# Patient Record
Sex: Male | Born: 1940
Health system: Southern US, Community
[De-identification: ages and names within clinical notes are randomized; demographics above are authoritative.]

## PROBLEM LIST (undated history)

## (undated) DIAGNOSIS — Z8719 Personal history of other diseases of the digestive system: Secondary | ICD-10-CM

## (undated) DIAGNOSIS — Z889 Allergy status to unspecified drugs, medicaments and biological substances status: Secondary | ICD-10-CM

## (undated) DIAGNOSIS — G8929 Other chronic pain: Secondary | ICD-10-CM

## (undated) DIAGNOSIS — N200 Calculus of kidney: Secondary | ICD-10-CM

## (undated) DIAGNOSIS — E041 Nontoxic single thyroid nodule: Secondary | ICD-10-CM

## (undated) DIAGNOSIS — I10 Essential (primary) hypertension: Secondary | ICD-10-CM

## (undated) DIAGNOSIS — IMO0001 Reserved for inherently not codable concepts without codable children: Secondary | ICD-10-CM

## (undated) DIAGNOSIS — M1990 Unspecified osteoarthritis, unspecified site: Secondary | ICD-10-CM

## (undated) DIAGNOSIS — C61 Malignant neoplasm of prostate: Secondary | ICD-10-CM

## (undated) DIAGNOSIS — E785 Hyperlipidemia, unspecified: Secondary | ICD-10-CM

## (undated) DIAGNOSIS — H9190 Unspecified hearing loss, unspecified ear: Secondary | ICD-10-CM

## (undated) DIAGNOSIS — M549 Dorsalgia, unspecified: Secondary | ICD-10-CM

## (undated) DIAGNOSIS — K219 Gastro-esophageal reflux disease without esophagitis: Secondary | ICD-10-CM

## (undated) HISTORY — PX: COLONOSCOPY: SHX174

## (undated) HISTORY — PX: PROSTATE BIOPSY: SHX241

## (undated) HISTORY — PX: OTHER SURGICAL HISTORY: SHX169

## (undated) HISTORY — PX: MOUTH SURGERY: SHX715

## (undated) HISTORY — PX: TONSILLECTOMY: SUR1361

## (undated) HISTORY — PX: VASECTOMY: SHX75

## (undated) HISTORY — PX: BIOPSY THYROID: PRO38

---

## 2000-04-13 ENCOUNTER — Ambulatory Visit (HOSPITAL_COMMUNITY): Admission: RE | Admit: 2000-04-13 | Discharge: 2000-04-13 | Payer: Self-pay | Admitting: *Deleted

## 2008-01-24 ENCOUNTER — Encounter: Admission: RE | Admit: 2008-01-24 | Discharge: 2008-01-24 | Payer: Self-pay | Admitting: Family Medicine

## 2008-02-06 ENCOUNTER — Encounter: Admission: RE | Admit: 2008-02-06 | Discharge: 2008-02-06 | Payer: Self-pay | Admitting: Family Medicine

## 2008-03-26 ENCOUNTER — Encounter: Admission: RE | Admit: 2008-03-26 | Discharge: 2008-03-26 | Payer: Self-pay | Admitting: Internal Medicine

## 2008-03-26 ENCOUNTER — Other Ambulatory Visit: Admission: RE | Admit: 2008-03-26 | Discharge: 2008-03-26 | Payer: Self-pay | Admitting: Interventional Radiology

## 2008-03-26 ENCOUNTER — Encounter (INDEPENDENT_AMBULATORY_CARE_PROVIDER_SITE_OTHER): Payer: Self-pay | Admitting: Interventional Radiology

## 2009-04-14 ENCOUNTER — Encounter: Admission: RE | Admit: 2009-04-14 | Discharge: 2009-04-14 | Payer: Self-pay | Admitting: Internal Medicine

## 2010-04-16 ENCOUNTER — Encounter: Admission: RE | Admit: 2010-04-16 | Discharge: 2010-04-16 | Payer: Self-pay | Admitting: Internal Medicine

## 2010-06-06 ENCOUNTER — Encounter: Payer: Self-pay | Admitting: Internal Medicine

## 2010-06-07 ENCOUNTER — Encounter: Payer: Self-pay | Admitting: Family Medicine

## 2011-04-22 ENCOUNTER — Other Ambulatory Visit: Payer: Self-pay | Admitting: Internal Medicine

## 2011-04-22 DIAGNOSIS — E042 Nontoxic multinodular goiter: Secondary | ICD-10-CM

## 2011-04-26 ENCOUNTER — Other Ambulatory Visit: Payer: Self-pay | Admitting: Neurosurgery

## 2011-04-26 DIAGNOSIS — M545 Low back pain: Secondary | ICD-10-CM

## 2011-04-27 ENCOUNTER — Ambulatory Visit
Admission: RE | Admit: 2011-04-27 | Discharge: 2011-04-27 | Disposition: A | Discharge status: Home / Self Care | Payer: Medicare Other | Source: Ambulatory Visit | Attending: Internal Medicine | Admitting: Internal Medicine

## 2011-04-27 DIAGNOSIS — E042 Nontoxic multinodular goiter: Secondary | ICD-10-CM

## 2011-04-29 ENCOUNTER — Ambulatory Visit
Admission: RE | Admit: 2011-04-29 | Discharge: 2011-04-29 | Disposition: A | Discharge status: Home / Self Care | Payer: Medicare Other | Source: Ambulatory Visit | Attending: Neurosurgery | Admitting: Neurosurgery

## 2011-04-29 DIAGNOSIS — M545 Low back pain: Secondary | ICD-10-CM

## 2011-05-18 ENCOUNTER — Other Ambulatory Visit: Payer: Self-pay | Admitting: Neurosurgery

## 2011-05-18 ENCOUNTER — Ambulatory Visit
Admission: RE | Admit: 2011-05-18 | Discharge: 2011-05-18 | Disposition: A | Discharge status: Home / Self Care | Payer: Medicare Other | Source: Ambulatory Visit | Attending: Neurosurgery | Admitting: Neurosurgery

## 2011-05-18 DIAGNOSIS — M47817 Spondylosis without myelopathy or radiculopathy, lumbosacral region: Secondary | ICD-10-CM | POA: Diagnosis not present

## 2011-05-18 DIAGNOSIS — M48061 Spinal stenosis, lumbar region without neurogenic claudication: Secondary | ICD-10-CM

## 2011-05-18 DIAGNOSIS — M4310 Spondylolisthesis, site unspecified: Secondary | ICD-10-CM | POA: Diagnosis not present

## 2011-07-25 ENCOUNTER — Encounter (HOSPITAL_COMMUNITY): Payer: Self-pay | Admitting: Pharmacy Technician

## 2011-07-25 NOTE — Pre-Procedure Instructions (Signed)
20 ARCANGEL MINION  07/25/2011   Your procedure is scheduled on:  Tues, Mar 19 @ 0730  Report to [facility] at 0530 AM.  Call this number if you have problems the morning of surgery: [phone]   Remember:   Do not eat food:After Midnight.  May have clear liquids: up to 4 Hours before arrival.(until 1:30 am)  Clear liquids include soda, tea, black coffee, apple or grape juice, broth.,water  Take these medicines the morning of surgery with A SIP OF WATER: Atenolol and Zantac   Do not wear jewelry, make-up or nail polish.  Do not wear lotions, powders, or perfumes. You may wear deodorant.  Do not shave 48 hours prior to surgery.  Do not bring valuables to the hospital.  Contacts, dentures or bridgework may not be worn into surgery.  Leave suitcase in the car. After surgery it may be brought to your room.  For patients admitted to the hospital, checkout time is 11:00 AM the day of discharge.    Special Instructions: CHG Shower Use Special Wash: 1/2 bottle night before surgery and 1/2 bottle morning of surgery.   Please read over the following fact sheets that you were given: Pain Booklet, Coughing and Deep Breathing, Blood Transfusion Information, MRSA Information and Surgical Site Infection Prevention

## 2011-07-26 ENCOUNTER — Encounter (HOSPITAL_COMMUNITY)
Admission: RE | Admit: 2011-07-26 | Discharge: 2011-07-26 | Disposition: A | Discharge status: Home / Self Care | Payer: Medicare Other | Source: Ambulatory Visit | Attending: Neurosurgery | Admitting: Neurosurgery

## 2011-07-26 ENCOUNTER — Other Ambulatory Visit: Payer: Self-pay

## 2011-07-26 ENCOUNTER — Encounter (HOSPITAL_COMMUNITY): Payer: Self-pay

## 2011-07-26 ENCOUNTER — Encounter (HOSPITAL_COMMUNITY)
Admission: RE | Admit: 2011-07-26 | Discharge: 2011-07-26 | Disposition: A | Discharge status: Home / Self Care | Payer: Medicare Other | Source: Ambulatory Visit | Attending: Anesthesiology | Admitting: Anesthesiology

## 2011-07-26 DIAGNOSIS — Z0289 Encounter for other administrative examinations: Secondary | ICD-10-CM | POA: Diagnosis not present

## 2011-07-26 DIAGNOSIS — M48062 Spinal stenosis, lumbar region with neurogenic claudication: Secondary | ICD-10-CM | POA: Diagnosis not present

## 2011-07-26 DIAGNOSIS — I10 Essential (primary) hypertension: Secondary | ICD-10-CM | POA: Diagnosis not present

## 2011-07-26 DIAGNOSIS — M4310 Spondylolisthesis, site unspecified: Secondary | ICD-10-CM | POA: Diagnosis not present

## 2011-07-26 DIAGNOSIS — E785 Hyperlipidemia, unspecified: Secondary | ICD-10-CM | POA: Diagnosis not present

## 2011-07-26 HISTORY — DX: Personal history of other diseases of the digestive system: Z87.19

## 2011-07-26 HISTORY — DX: Essential (primary) hypertension: I10

## 2011-07-26 HISTORY — DX: Nontoxic single thyroid nodule: E04.1

## 2011-07-26 HISTORY — DX: Calculus of kidney: N20.0

## 2011-07-26 HISTORY — DX: Gastro-esophageal reflux disease without esophagitis: K21.9

## 2011-07-26 HISTORY — DX: Other chronic pain: G89.29

## 2011-07-26 HISTORY — DX: Allergy status to unspecified drugs, medicaments and biological substances: Z88.9

## 2011-07-26 HISTORY — DX: Hyperlipidemia, unspecified: E78.5

## 2011-07-26 HISTORY — DX: Reserved for inherently not codable concepts without codable children: IMO0001

## 2011-07-26 HISTORY — DX: Unspecified hearing loss, unspecified ear: H91.90

## 2011-07-26 HISTORY — DX: Dorsalgia, unspecified: M54.9

## 2011-07-26 HISTORY — DX: Unspecified osteoarthritis, unspecified site: M19.90

## 2011-07-26 LAB — CBC
HCT: 46.5 % (ref 39.0–52.0)
MCH: 32.5 pg (ref 26.0–34.0)
MCV: 91.5 fL (ref 78.0–100.0)
Platelets: 254 10*3/uL (ref 150–400)
RBC: 5.08 MIL/uL (ref 4.22–5.81)
WBC: 9 10*3/uL (ref 4.0–10.5)

## 2011-07-26 LAB — BASIC METABOLIC PANEL
CO2: 29 mEq/L (ref 19–32)
Calcium: 10.5 mg/dL (ref 8.4–10.5)
Chloride: 100 mEq/L (ref 96–112)
Creatinine, Ser: 0.95 mg/dL (ref 0.50–1.35)
Glucose, Bld: 95 mg/dL (ref 70–99)
Sodium: 139 mEq/L (ref 135–145)

## 2011-07-26 LAB — DIFFERENTIAL
Eosinophils Absolute: 0.3 10*3/uL (ref 0.0–0.7)
Eosinophils Relative: 4 % (ref 0–5)
Lymphocytes Relative: 24 % (ref 12–46)
Lymphs Abs: 2.1 10*3/uL (ref 0.7–4.0)
Monocytes Absolute: 0.7 10*3/uL (ref 0.1–1.0)

## 2011-07-26 LAB — SURGICAL PCR SCREEN
MRSA, PCR: NEGATIVE
Staphylococcus aureus: NEGATIVE

## 2011-07-26 LAB — TYPE AND SCREEN: ABO/RH(D): A POS

## 2011-07-26 LAB — ABO/RH: ABO/RH(D): A POS

## 2011-07-26 NOTE — Progress Notes (Signed)
Dr.Varanasi is cardiologist;last visit a couple of years ago and was released  Echo done about 68yrs ago Stress test done > 27yrs ago Denies having a heart cath

## 2011-07-27 NOTE — Progress Notes (Signed)
Voicemail left with Erie Noe at Dr. Lindalou Hose office requesting orders as they were illegible.

## 2011-08-01 ENCOUNTER — Other Ambulatory Visit (HOSPITAL_COMMUNITY): Payer: Self-pay | Admitting: *Deleted

## 2011-08-01 MED ORDER — CEFAZOLIN SODIUM-DEXTROSE 2-3 GM-% IV SOLR
2.0000 g | INTRAVENOUS | Status: AC
  Administered 2011-08-02: 2 g via INTRAVENOUS
  Filled 2011-08-01: qty 50

## 2011-08-01 MED ORDER — CEFAZOLIN SODIUM 1-5 GM-% IV SOLN: 1.0000 g | INTRAVENOUS | Status: DC

## 2011-08-01 NOTE — Progress Notes (Signed)
Spoke with Erie Noe at Dr Dalphine Handing office to obtain orders with permit clarified and she stated "OK I will send them over"

## 2011-08-02 ENCOUNTER — Inpatient Hospital Stay (HOSPITAL_COMMUNITY): Payer: Medicare Other | Admitting: Certified Registered"

## 2011-08-02 ENCOUNTER — Inpatient Hospital Stay (HOSPITAL_COMMUNITY)
Admission: RE | Admit: 2011-08-02 | Discharge: 2011-08-03 | DRG: 460 | Disposition: A | Discharge status: Home / Self Care | Payer: Medicare Other | Source: Ambulatory Visit | Attending: Neurosurgery | Admitting: Neurosurgery

## 2011-08-02 ENCOUNTER — Encounter (HOSPITAL_COMMUNITY): Payer: Self-pay | Admitting: *Deleted

## 2011-08-02 ENCOUNTER — Encounter (HOSPITAL_COMMUNITY): Payer: Self-pay | Admitting: Certified Registered"

## 2011-08-02 ENCOUNTER — Encounter (HOSPITAL_COMMUNITY)
Admission: RE | Disposition: A | Discharge status: Home / Self Care | Payer: Self-pay | Source: Ambulatory Visit | Attending: Neurosurgery

## 2011-08-02 ENCOUNTER — Encounter (HOSPITAL_COMMUNITY): Payer: Self-pay | Admitting: Neurosurgery

## 2011-08-02 ENCOUNTER — Inpatient Hospital Stay (HOSPITAL_COMMUNITY): Payer: Medicare Other

## 2011-08-02 DIAGNOSIS — Z91041 Radiographic dye allergy status: Secondary | ICD-10-CM

## 2011-08-02 DIAGNOSIS — G8929 Other chronic pain: Secondary | ICD-10-CM | POA: Diagnosis present

## 2011-08-02 DIAGNOSIS — K219 Gastro-esophageal reflux disease without esophagitis: Secondary | ICD-10-CM | POA: Diagnosis present

## 2011-08-02 DIAGNOSIS — Z79899 Other long term (current) drug therapy: Secondary | ICD-10-CM | POA: Diagnosis not present

## 2011-08-02 DIAGNOSIS — I10 Essential (primary) hypertension: Secondary | ICD-10-CM | POA: Diagnosis not present

## 2011-08-02 DIAGNOSIS — M4310 Spondylolisthesis, site unspecified: Secondary | ICD-10-CM | POA: Diagnosis present

## 2011-08-02 DIAGNOSIS — E785 Hyperlipidemia, unspecified: Secondary | ICD-10-CM | POA: Diagnosis present

## 2011-08-02 DIAGNOSIS — M79609 Pain in unspecified limb: Secondary | ICD-10-CM | POA: Diagnosis not present

## 2011-08-02 DIAGNOSIS — M519 Unspecified thoracic, thoracolumbar and lumbosacral intervertebral disc disorder: Secondary | ICD-10-CM | POA: Diagnosis not present

## 2011-08-02 DIAGNOSIS — Q762 Congenital spondylolisthesis: Secondary | ICD-10-CM | POA: Diagnosis not present

## 2011-08-02 DIAGNOSIS — Z01812 Encounter for preprocedural laboratory examination: Secondary | ICD-10-CM | POA: Diagnosis not present

## 2011-08-02 DIAGNOSIS — M48062 Spinal stenosis, lumbar region with neurogenic claudication: Secondary | ICD-10-CM | POA: Diagnosis not present

## 2011-08-02 DIAGNOSIS — M48061 Spinal stenosis, lumbar region without neurogenic claudication: Secondary | ICD-10-CM | POA: Diagnosis not present

## 2011-08-02 DIAGNOSIS — M545 Low back pain: Secondary | ICD-10-CM | POA: Diagnosis not present

## 2011-08-02 DIAGNOSIS — Z7982 Long term (current) use of aspirin: Secondary | ICD-10-CM

## 2011-08-02 DIAGNOSIS — M5137 Other intervertebral disc degeneration, lumbosacral region: Secondary | ICD-10-CM | POA: Diagnosis not present

## 2011-08-02 SURGERY — POSTERIOR LUMBAR FUSION 1 LEVEL
Anesthesia: General | Site: Back | Wound class: Clean

## 2011-08-02 MED ORDER — GLYCOPYRROLATE 0.2 MG/ML IJ SOLN
INTRAMUSCULAR | Status: DC | PRN
  Administered 2011-08-02: .4 mg via INTRAVENOUS

## 2011-08-02 MED ORDER — DROPERIDOL 2.5 MG/ML IJ SOLN
INTRAMUSCULAR | Status: DC | PRN
  Administered 2011-08-02: 0.625 mg via INTRAVENOUS

## 2011-08-02 MED ORDER — ATENOLOL 50 MG PO TABS
50.0000 mg | ORAL_TABLET | Freq: Every day | ORAL | Status: DC
  Administered 2011-08-03: 50 mg via ORAL
  Filled 2011-08-02 (×2): qty 1

## 2011-08-02 MED ORDER — THROMBIN 20000 UNITS EX KIT
PACK | CUTANEOUS | Status: DC | PRN
  Administered 2011-08-02: 09:00:00 via TOPICAL

## 2011-08-02 MED ORDER — BISACODYL 10 MG RE SUPP: 10.0000 mg | Freq: Every day | RECTAL | Status: DC | PRN

## 2011-08-02 MED ORDER — ZOLPIDEM TARTRATE 5 MG PO TABS: 5.0000 mg | ORAL_TABLET | Freq: Every evening | ORAL | Status: DC | PRN

## 2011-08-02 MED ORDER — HYDROMORPHONE HCL PF 1 MG/ML IJ SOLN
0.2500 mg | INTRAMUSCULAR | Status: DC | PRN
  Administered 2011-08-02 (×4): 0.5 mg via INTRAVENOUS

## 2011-08-02 MED ORDER — 0.9 % SODIUM CHLORIDE (POUR BTL) OPTIME
TOPICAL | Status: DC | PRN
  Administered 2011-08-02: 1000 mL

## 2011-08-02 MED ORDER — ACETAMINOPHEN 325 MG PO TABS: 650.0000 mg | ORAL_TABLET | ORAL | Status: DC | PRN

## 2011-08-02 MED ORDER — ATORVASTATIN CALCIUM 40 MG PO TABS
40.0000 mg | ORAL_TABLET | Freq: Every day | ORAL | Status: DC
  Administered 2011-08-02: 40 mg via ORAL
  Filled 2011-08-02 (×2): qty 1

## 2011-08-02 MED ORDER — HYDROCHLOROTHIAZIDE 25 MG PO TABS
25.0000 mg | ORAL_TABLET | Freq: Every day | ORAL | Status: DC
  Administered 2011-08-02 – 2011-08-03 (×2): 25 mg via ORAL
  Filled 2011-08-02 (×2): qty 1

## 2011-08-02 MED ORDER — FLEET ENEMA 7-19 GM/118ML RE ENEM
1.0000 | ENEMA | Freq: Once | RECTAL | Status: AC | PRN
  Filled 2011-08-02: qty 1

## 2011-08-02 MED ORDER — OMEGA-3-ACID ETHYL ESTERS 1 G PO CAPS
1.0000 g | ORAL_CAPSULE | Freq: Two times a day (BID) | ORAL | Status: DC
  Administered 2011-08-02 – 2011-08-03 (×2): 1 g via ORAL
  Filled 2011-08-02 (×4): qty 1

## 2011-08-02 MED ORDER — DEXAMETHASONE SODIUM PHOSPHATE 10 MG/ML IJ SOLN: 10.0000 mg | Freq: Once | INTRAMUSCULAR | Status: DC

## 2011-08-02 MED ORDER — BUPIVACAINE HCL (PF) 0.25 % IJ SOLN
INTRAMUSCULAR | Status: DC | PRN
  Administered 2011-08-02: 30 mL

## 2011-08-02 MED ORDER — SODIUM CHLORIDE 0.9 % IV SOLN
250.0000 mL | INTRAVENOUS | Status: DC
  Administered 2011-08-02: 250 mL via INTRAVENOUS

## 2011-08-02 MED ORDER — HYDROMORPHONE HCL PF 1 MG/ML IJ SOLN
0.5000 mg | INTRAMUSCULAR | Status: DC | PRN
  Administered 2011-08-02: 1 mg via INTRAVENOUS
  Filled 2011-08-02: qty 1

## 2011-08-02 MED ORDER — SODIUM CHLORIDE 0.9 % IR SOLN
Status: DC | PRN
  Administered 2011-08-02: 09:00:00

## 2011-08-02 MED ORDER — MENTHOL 3 MG MT LOZG
1.0000 | LOZENGE | OROMUCOSAL | Status: DC | PRN
  Filled 2011-08-02 (×2): qty 9

## 2011-08-02 MED ORDER — ROCURONIUM BROMIDE 100 MG/10ML IV SOLN
INTRAVENOUS | Status: DC | PRN
  Administered 2011-08-02: 50 mg via INTRAVENOUS
  Administered 2011-08-02: 20 mg via INTRAVENOUS

## 2011-08-02 MED ORDER — NEOSTIGMINE METHYLSULFATE 1 MG/ML IJ SOLN
INTRAMUSCULAR | Status: DC | PRN
  Administered 2011-08-02: 3 mg via INTRAVENOUS

## 2011-08-02 MED ORDER — OXYCODONE-ACETAMINOPHEN 5-325 MG PO TABS
1.0000 | ORAL_TABLET | ORAL | Status: DC | PRN
  Administered 2011-08-02 – 2011-08-03 (×4): 2 via ORAL
  Filled 2011-08-02 (×4): qty 2

## 2011-08-02 MED ORDER — HYDROCODONE-ACETAMINOPHEN 5-325 MG PO TABS: 1.0000 | ORAL_TABLET | ORAL | Status: DC | PRN

## 2011-08-02 MED ORDER — FAMOTIDINE 20 MG PO TABS
20.0000 mg | ORAL_TABLET | Freq: Every day | ORAL | Status: DC
  Administered 2011-08-02 – 2011-08-03 (×2): 20 mg via ORAL
  Filled 2011-08-02 (×2): qty 1

## 2011-08-02 MED ORDER — DIAZEPAM 5 MG PO TABS
5.0000 mg | ORAL_TABLET | Freq: Four times a day (QID) | ORAL | Status: DC | PRN
  Administered 2011-08-02 – 2011-08-03 (×4): 5 mg via ORAL
  Filled 2011-08-02 (×4): qty 1

## 2011-08-02 MED ORDER — POLYETHYLENE GLYCOL 3350 17 G PO PACK
17.0000 g | PACK | Freq: Every day | ORAL | Status: DC | PRN
  Filled 2011-08-02: qty 1

## 2011-08-02 MED ORDER — DEXAMETHASONE SODIUM PHOSPHATE 4 MG/ML IJ SOLN
INTRAMUSCULAR | Status: DC | PRN
  Administered 2011-08-02: 10 mg via INTRAVENOUS

## 2011-08-02 MED ORDER — SODIUM CHLORIDE 0.9 % IV SOLN
INTRAVENOUS | Status: AC
  Filled 2011-08-02: qty 500

## 2011-08-02 MED ORDER — LACTATED RINGERS IV SOLN
INTRAVENOUS | Status: DC | PRN
  Administered 2011-08-02 (×2): via INTRAVENOUS

## 2011-08-02 MED ORDER — OMEGA-3 FATTY ACIDS 1000 MG PO CAPS: 1.0000 g | ORAL_CAPSULE | Freq: Two times a day (BID) | ORAL | Status: DC

## 2011-08-02 MED ORDER — HYDROMORPHONE HCL PF 1 MG/ML IJ SOLN
INTRAMUSCULAR | Status: AC
  Filled 2011-08-02: qty 1

## 2011-08-02 MED ORDER — LISINOPRIL 20 MG PO TABS
20.0000 mg | ORAL_TABLET | Freq: Every day | ORAL | Status: DC
  Administered 2011-08-02 – 2011-08-03 (×2): 20 mg via ORAL
  Filled 2011-08-02 (×2): qty 1

## 2011-08-02 MED ORDER — SODIUM CHLORIDE 0.9 % IJ SOLN
3.0000 mL | Freq: Two times a day (BID) | INTRAMUSCULAR | Status: DC
  Administered 2011-08-02 (×2): 3 mL via INTRAVENOUS

## 2011-08-02 MED ORDER — LIDOCAINE HCL (CARDIAC) 20 MG/ML IV SOLN
INTRAVENOUS | Status: DC | PRN
  Administered 2011-08-02: 100 mg via INTRAVENOUS

## 2011-08-02 MED ORDER — ONDANSETRON HCL 4 MG/2ML IJ SOLN: 4.0000 mg | INTRAMUSCULAR | Status: DC | PRN

## 2011-08-02 MED ORDER — ONDANSETRON HCL 4 MG/2ML IJ SOLN: 4.0000 mg | Freq: Once | INTRAMUSCULAR | Status: DC | PRN

## 2011-08-02 MED ORDER — DEXAMETHASONE SODIUM PHOSPHATE 10 MG/ML IJ SOLN
INTRAMUSCULAR | Status: AC
  Filled 2011-08-02: qty 1

## 2011-08-02 MED ORDER — ALUM & MAG HYDROXIDE-SIMETH 200-200-20 MG/5ML PO SUSP: 30.0000 mL | Freq: Four times a day (QID) | ORAL | Status: DC | PRN

## 2011-08-02 MED ORDER — ACETAMINOPHEN 650 MG RE SUPP: 650.0000 mg | RECTAL | Status: DC | PRN

## 2011-08-02 MED ORDER — SUFENTANIL CITRATE 50 MCG/ML IV SOLN
INTRAVENOUS | Status: DC | PRN
  Administered 2011-08-02 (×3): 10 ug via INTRAVENOUS

## 2011-08-02 MED ORDER — SENNA 8.6 MG PO TABS
1.0000 | ORAL_TABLET | Freq: Two times a day (BID) | ORAL | Status: DC
  Administered 2011-08-02 – 2011-08-03 (×2): 8.6 mg via ORAL
  Filled 2011-08-02 (×3): qty 1

## 2011-08-02 MED ORDER — BACITRACIN 50000 UNITS IM SOLR
INTRAMUSCULAR | Status: AC
  Filled 2011-08-02: qty 1

## 2011-08-02 MED ORDER — ONDANSETRON HCL 4 MG/2ML IJ SOLN
INTRAMUSCULAR | Status: DC | PRN
  Administered 2011-08-02: 4 mg via INTRAVENOUS

## 2011-08-02 MED ORDER — PHENOL 1.4 % MT LIQD: 1.0000 | OROMUCOSAL | Status: DC | PRN

## 2011-08-02 MED ORDER — CEFAZOLIN SODIUM 1-5 GM-% IV SOLN
1.0000 g | Freq: Three times a day (TID) | INTRAVENOUS | Status: AC
  Administered 2011-08-02 (×2): 1 g via INTRAVENOUS
  Filled 2011-08-02 (×3): qty 50

## 2011-08-02 MED ORDER — ASPIRIN 325 MG PO TABS
325.0000 mg | ORAL_TABLET | Freq: Every day | ORAL | Status: DC
  Administered 2011-08-02 – 2011-08-03 (×2): 325 mg via ORAL
  Filled 2011-08-02 (×2): qty 1

## 2011-08-02 MED ORDER — SODIUM CHLORIDE 0.9 % IJ SOLN: 3.0000 mL | INTRAMUSCULAR | Status: DC | PRN

## 2011-08-02 MED ORDER — LIDOCAINE HCL 4 % MT SOLN
OROMUCOSAL | Status: DC | PRN
  Administered 2011-08-02: 4 mL via TOPICAL

## 2011-08-02 MED ORDER — PROPOFOL 10 MG/ML IV EMUL
INTRAVENOUS | Status: DC | PRN
  Administered 2011-08-02: 160 mg via INTRAVENOUS

## 2011-08-02 MED ORDER — MIDAZOLAM HCL 5 MG/5ML IJ SOLN
INTRAMUSCULAR | Status: DC | PRN
  Administered 2011-08-02: 2 mg via INTRAVENOUS

## 2011-08-02 SURGICAL SUPPLY — 71 items
ADH SKN CLS APL DERMABOND .7 (GAUZE/BANDAGES/DRESSINGS)
ADH SKN CLS LQ APL DERMABOND (GAUZE/BANDAGES/DRESSINGS) ×1
APL SKNCLS STERI-STRIP NONHPOA (GAUZE/BANDAGES/DRESSINGS) ×1
BAG DECANTER FOR FLEXI CONT (MISCELLANEOUS) ×2 IMPLANT
BENZOIN TINCTURE PRP APPL 2/3 (GAUZE/BANDAGES/DRESSINGS) ×2 IMPLANT
BLADE SURG ROTATE 9660 (MISCELLANEOUS) ×1 IMPLANT
BRUSH SCRUB EZ PLAIN DRY (MISCELLANEOUS) ×2 IMPLANT
BUR MATCHSTICK NEURO 3.0 LAGG (BURR) ×2 IMPLANT
CANISTER SUCTION 2500CC (MISCELLANEOUS) ×2 IMPLANT
CAP LCK SPNE (Orthopedic Implant) ×4 IMPLANT
CAP LOCK SPINE RADIUS (Orthopedic Implant) IMPLANT
CAP LOCKING (Orthopedic Implant) ×8 IMPLANT
CLOTH BEACON ORANGE TIMEOUT ST (SAFETY) ×2 IMPLANT
CONT SPEC 4OZ CLIKSEAL STRL BL (MISCELLANEOUS) ×4 IMPLANT
COVER BACK TABLE 24X17X13 BIG (DRAPES) IMPLANT
COVER TABLE BACK 60X90 (DRAPES) ×2 IMPLANT
DECANTER SPIKE VIAL GLASS SM (MISCELLANEOUS) ×2 IMPLANT
DERMABOND ADHESIVE PROPEN (GAUZE/BANDAGES/DRESSINGS) ×1
DERMABOND ADVANCED (GAUZE/BANDAGES/DRESSINGS)
DERMABOND ADVANCED .7 DNX12 (GAUZE/BANDAGES/DRESSINGS) ×1 IMPLANT
DERMABOND ADVANCED .7 DNX6 (GAUZE/BANDAGES/DRESSINGS) IMPLANT
DRAPE C-ARM 42X72 X-RAY (DRAPES) ×4 IMPLANT
DRAPE LAPAROTOMY 100X72X124 (DRAPES) ×2 IMPLANT
DRAPE POUCH INSTRU U-SHP 10X18 (DRAPES) ×2 IMPLANT
DRAPE PROXIMA HALF (DRAPES) IMPLANT
DRAPE SURG 17X23 STRL (DRAPES) ×8 IMPLANT
ELECT REM PT RETURN 9FT ADLT (ELECTROSURGICAL) ×2
ELECTRODE REM PT RTRN 9FT ADLT (ELECTROSURGICAL) ×1 IMPLANT
EVACUATOR 1/8 PVC DRAIN (DRAIN) ×2 IMPLANT
GAUZE SPONGE 4X4 16PLY XRAY LF (GAUZE/BANDAGES/DRESSINGS) ×1 IMPLANT
GLOVE BIOGEL PI IND STRL 7.0 (GLOVE) IMPLANT
GLOVE BIOGEL PI IND STRL 8.5 (GLOVE) IMPLANT
GLOVE BIOGEL PI INDICATOR 7.0 (GLOVE) ×1
GLOVE BIOGEL PI INDICATOR 8.5 (GLOVE) ×1
GLOVE ECLIPSE 8.5 STRL (GLOVE) ×6 IMPLANT
GLOVE EXAM NITRILE LRG STRL (GLOVE) IMPLANT
GLOVE EXAM NITRILE MD LF STRL (GLOVE) ×2 IMPLANT
GLOVE EXAM NITRILE XL STR (GLOVE) IMPLANT
GLOVE EXAM NITRILE XS STR PU (GLOVE) IMPLANT
GLOVE SS BIOGEL STRL SZ 6.5 (GLOVE) IMPLANT
GLOVE SUPERSENSE BIOGEL SZ 6.5 (GLOVE) ×2
GLOVE SURG SS PI 6.5 STRL IVOR (GLOVE) ×1 IMPLANT
GOWN BRE IMP SLV AUR LG STRL (GOWN DISPOSABLE) ×1 IMPLANT
GOWN BRE IMP SLV AUR XL STRL (GOWN DISPOSABLE) ×4 IMPLANT
GOWN STRL REIN 2XL LVL4 (GOWN DISPOSABLE) ×1 IMPLANT
KIT BASIN OR (CUSTOM PROCEDURE TRAY) ×2 IMPLANT
KIT ROOM TURNOVER OR (KITS) ×2 IMPLANT
MILL MEDIUM DISP (BLADE) ×1 IMPLANT
NEEDLE HYPO 22GX1.5 SAFETY (NEEDLE) ×2 IMPLANT
NS IRRIG 1000ML POUR BTL (IV SOLUTION) ×2 IMPLANT
PACK LAMINECTOMY NEURO (CUSTOM PROCEDURE TRAY) ×2 IMPLANT
ROD RADIUS 40MM (Neuro Prosthesis/Implant) ×2 IMPLANT
ROD RADIUS 45MM (Rod) ×2 IMPLANT
ROD SPNL 40X5.5XNS TI RDS (Neuro Prosthesis/Implant) IMPLANT
ROD SPNL 45X5.5XNS TI RDS (Rod) IMPLANT
SCREW 6.75X45MM (Screw) ×4 IMPLANT
SPONGE GAUZE 4X4 12PLY (GAUZE/BANDAGES/DRESSINGS) ×2 IMPLANT
SPONGE SURGIFOAM ABS GEL 100 (HEMOSTASIS) ×2 IMPLANT
STRIP CLOSURE SKIN 1/2X4 (GAUZE/BANDAGES/DRESSINGS) ×4 IMPLANT
SUT VIC AB 0 CT1 18XCR BRD8 (SUTURE) ×2 IMPLANT
SUT VIC AB 0 CT1 8-18 (SUTURE) ×2
SUT VIC AB 2-0 CT1 18 (SUTURE) ×2 IMPLANT
SUT VIC AB 3-0 SH 8-18 (SUTURE) ×3 IMPLANT
SYR 20ML ECCENTRIC (SYRINGE) ×2 IMPLANT
TAPE CLOTH SURG 4X10 WHT LF (GAUZE/BANDAGES/DRESSINGS) ×1 IMPLANT
TELAMON 12X22 (Cage) ×1 IMPLANT
TOWEL OR 17X24 6PK STRL BLUE (TOWEL DISPOSABLE) ×2 IMPLANT
TOWEL OR 17X26 10 PK STRL BLUE (TOWEL DISPOSABLE) ×2 IMPLANT
TRAY FOLEY CATH 14FRSI W/METER (CATHETERS) ×2 IMPLANT
WATER STERILE IRR 1000ML POUR (IV SOLUTION) ×2 IMPLANT
WEDGE TANGENT 12X26MM ×1 IMPLANT

## 2011-08-02 NOTE — OR Nursing (Signed)
Dr. Noreene Larsson attempted to get wedding band off.Decided to leave on and will watch the finger for swelling.If swelling becomes an issue will cut ring off.DDay RN

## 2011-08-02 NOTE — Anesthesia Preprocedure Evaluation (Addendum)
Anesthesia Evaluation  Patient identified by MRN, date of birth, ID band Patient awake, Patient confused and Patient unresponsive    Reviewed: Allergy & Precautions, H&P , NPO status , Patient's Chart, lab work & pertinent test results  Airway Mallampati: I      Dental  (+) Partial Upper   Pulmonary neg pulmonary ROS,  breath sounds clear to auscultation        Cardiovascular Exercise Tolerance: Good hypertension, Pt. on medications and Pt. on home beta blockers Rhythm:Regular Rate:Normal     Neuro/Psych negative neurological ROS  negative psych ROS   GI/Hepatic Neg liver ROS,   Endo/Other  negative endocrine ROS  Renal/GU negative Renal ROS  negative genitourinary   Musculoskeletal negative musculoskeletal ROS (+)   Abdominal   Peds  Hematology negative hematology ROS (+)   Anesthesia Other Findings   Reproductive/Obstetrics                          Anesthesia Physical Anesthesia Plan  ASA: II  Anesthesia Plan: General   Post-op Pain Management:    Induction: Intravenous  Airway Management Planned: Oral ETT  Additional Equipment:   Intra-op Plan:   Post-operative Plan: Extubation in OR  Informed Consent: I have reviewed the patients History and Physical, chart, labs and discussed the procedure including the risks, benefits and alternatives for the proposed anesthesia with the patient or authorized representative who has indicated his/her understanding and acceptance.   Dental advisory given  Plan Discussed with: CRNA, Anesthesiologist and Surgeon  Anesthesia Plan Comments: (Lumbar Spondylosis htn GERD Mild Obesity  Plan GA  Kipp Brood, MD)       Anesthesia Quick Evaluation

## 2011-08-02 NOTE — Transfer of Care (Signed)
Immediate Anesthesia Transfer of Care Note  Patient: Jose Schmitt  Procedure(s) Performed: Procedure(s) (LRB): POSTERIOR LUMBAR FUSION 1 LEVEL (N/A)  Patient Location: PACU  Anesthesia Type: General  Level of Consciousness: awake, alert , oriented, patient cooperative and responds to stimulation  Airway & Oxygen Therapy: Patient Spontanous Breathing and Patient connected to nasal cannula oxygen  Post-op Assessment: Report given to PACU RN, Post -op Vital signs reviewed and stable and Patient moving all extremities  Post vital signs: Reviewed and stable  Complications: No apparent anesthesia complications

## 2011-08-02 NOTE — H&P (Signed)
Jose Schmitt is an 71 y.o. male.   Chief Complaint: Back pain and bilateral lower extremity numbness HPI: 71 year old male with history of progressive back pain bilateral extremity numbness paresthesias and weakness consistent with neurogenic claudication. Failed conservative management. Workup demonstrates evidence of an unstable degenerative spondylolisthesis at L3-4 with marked stenosis and facet arthropathy. Patient presents now for L3-4 posterior lumbar decompression and fusion with instrumentation for hopeful improvement of symptoms.  Past Medical History  Diagnosis Date  . Hypertension     takes Lisinopril,Atenolol,Hctz daily  . Hyperlipidemia     takes Zocor nightly  . Hx of seasonal allergies   . Arthritis   . Chronic back pain     DDD and stenosis  . GERD (gastroesophageal reflux disease)     takes Zantac daily  . History of IBS   . Kidney stones     hx of   . Thyroid nodule   . Impaired hearing     right ear    Past Surgical History  Procedure Date  . Tonsillectomy as a child  . Vasectomy   . Cataract surgery 4-86yrs ago    bilateral   . Mouth surgery     impacted wisdom teeth  . Colonoscopy   . Biopsy thyroid     Family History  Problem Relation Age of Onset  . Anesthesia problems Neg Hx   . Hypotension Neg Hx   . Malignant hyperthermia Neg Hx   . Pseudochol deficiency Neg Hx    Social History:  does not have a smoking history on file. He does not have any smokeless tobacco history on file. He reports that he drinks alcohol. He reports that he does not use illicit drugs.  Allergies:  Allergies  Allergen Reactions  . Contrast Media (Iodinated Diagnostic Agents) Itching and Rash    Medications Prior to Admission  Medication Dose Route Frequency Provider Last Rate Last Dose  . bacitracin 29562 UNITS injection           . ceFAZolin (ANCEF) IVPB 2 g/50 mL premix  2 g Intravenous 60 min Pre-Op Hessie Diener Markle, PHARMD      . dexamethasone (DECADRON) 10  MG/ML injection           . dexamethasone (DECADRON) injection 10 mg  10 mg Intravenous Once Temple Pacini, MD      . HYDROmorphone (DILAUDID) injection 0.25-0.5 mg  0.25-0.5 mg Intravenous Q5 min PRN Kipp Brood, MD      . ondansetron [facility]) injection 4 mg  4 mg Intravenous Once PRN Kipp Brood, MD      . sodium chloride 0.9 % infusion           . DISCONTD: ceFAZolin (ANCEF) IVPB 1 g/50 mL premix  1 g Intravenous 60 min Pre-Op Temple Pacini, MD       Medications Prior to Admission  Medication Sig Dispense Refill  . aspirin 325 MG tablet Take 325 mg by mouth daily.      Marland Kitchen atenolol (TENORMIN) 50 MG tablet Take 50 mg by mouth daily.      . fish oil-omega-3 fatty acids 1000 MG capsule Take 1 g by mouth 2 (two) times daily.      . hydrochlorothiazide (HYDRODIURIL) 25 MG tablet Take 25 mg by mouth daily.      Marland Kitchen lisinopril (PRINIVIL,ZESTRIL) 20 MG tablet Take 20 mg by mouth daily.      . naproxen sodium (ANAPROX) 220 MG tablet Take 220-440 mg by mouth  2 (two) times daily as needed. For knee pain      . ranitidine (ZANTAC) 150 MG tablet Take 150 mg by mouth daily.      . simvastatin (ZOCOR) 80 MG tablet Take 80 mg by mouth at bedtime.        No results found for this or any previous visit (from the past 48 hour(s)). No results found.  Review of Systems  Constitutional: Negative.   HENT: Negative.   Eyes: Negative.   Respiratory: Negative.   Cardiovascular: Negative.   Gastrointestinal: Negative.   Genitourinary: Negative.   Musculoskeletal: Negative.   Skin: Negative.   Neurological: Negative.   Endo/Heme/Allergies: Negative.   Psychiatric/Behavioral: Negative.     Blood pressure 191/93, pulse 74, temperature 97.8 F (36.6 C), temperature source Oral, SpO2 94.00%. Physical Exam  Constitutional: He is oriented to person, place, and time. He appears well-developed and well-nourished.  HENT:  Head: Normocephalic and atraumatic.  Right Ear: External ear normal.  Left Ear: External  ear normal.  Eyes: Conjunctivae and EOM are normal. Pupils are equal, round, and reactive to light.  Neck: Normal range of motion. Neck supple. No tracheal deviation present. No thyromegaly present.  Cardiovascular: Normal rate, regular rhythm, normal heart sounds and intact distal pulses.  Exam reveals no friction rub.   No murmur heard. Respiratory: Effort normal and breath sounds normal. No respiratory distress. He has no wheezes.  GI: Soft. Bowel sounds are normal.  Musculoskeletal: Normal range of motion. He exhibits no edema and no tenderness.  Neurological: He is alert and oriented to person, place, and time. He has normal reflexes. No cranial nerve deficit. Coordination normal.  Skin: Skin is warm and dry.  Psychiatric: He has a normal mood and affect. His behavior is normal. Judgment and thought content normal.     Assessment/Plan Unstable degenerative spondylolisthesis with stenosis. Plan L3-4 decompressive laminectomy with L3-4 posterior lumbar interbody fusion utilizing tangent interbody allograft wedge Telamon interbody peek cage and local autographing. For posterior lateral arthrodesis utilizing nonsegmental pedicle screw sedation and local autographing. Risks and benefits explained. Patient wishes to proceed.  Jose Schmitt A 08/02/2011, 7:42 AM

## 2011-08-02 NOTE — Anesthesia Postprocedure Evaluation (Signed)
  Anesthesia Post-op Note  Patient: Jose Schmitt  Procedure(s) Performed: Procedure(s) (LRB): POSTERIOR LUMBAR FUSION 1 LEVEL (N/A)  Patient Location: PACU  Anesthesia Type: General  Level of Consciousness: awake, alert  and oriented  Airway and Oxygen Therapy: Patient Spontanous Breathing and Patient connected to nasal cannula oxygen  Post-op Pain: mild  Post-op Assessment: Post-op Vital signs reviewed and Patient's Cardiovascular Status Stable  Post-op Vital Signs: stable  Complications: No apparent anesthesia complications

## 2011-08-02 NOTE — Op Note (Signed)
Date of procedure: 08/02/2011   Date of dictation: Same  Service: Neurosurgery  Preoperative diagnosis: Unstable L3-4 degenerative spondylolisthesis with stenosis.  Postoperative diagnosis: Same  Procedure Name: L3-4 decompressive laminectomy with bilateral L3 and L4 decompressive foraminotomies, more than would be required for simple interbody fusion alone.  L3-4 posterior lumbar interbody fusion using tangent interbody allograft wedge Telamon interbody peek cage and local autographing.  L3-4 posterior lateral arthrodesis utilizing nonsegmental pedicle screw sedation and local autographing.  Surgeon:Ulysee Fyock A.Khandi Kernes, M.D.  Asst. Surgeon: Barnett Abu  Anesthesia: General  Indication: 71 year old male with back and bilateral lower extremity pain with associated paresthesias and numbness and weakness consistent with neurogenic claudication failing conservative management. Workup demonstrates evidence of an unstable grade 1 L3-4 degenerative spondylolisthesis with marked facet arthropathy and stenosis. Patient presents now for L3-4 decompression and fusion.  Operative note: After induction of anesthesia, patient was positioned prone onto a Wilson frame and appropriate padded. Patient's lumbar region was prepped and draped sterilely. Incision was made bilateral subperiosteal dissection then performed so the lamina and facet joints of L3 and L4 and the transverse processes of L3 and L4. Deep self retaining retractors placed intraoperative fluoroscopy is used levels were confirmed. Decompressive laminectomies then performed using Leksell orders Kerr[employer] years high-speed drill to remove the entire lamina of L3 into her facets L3 and superior facet of L4 superior aspect of lamina of L4. All bone is cleaned in use and later autografting. Wide decompressive foraminotomies were then performed along the course the exiting L3 and L4 nerve roots bilaterally. Bilateral discectomies were then performed at L3-4.  The space and distraction up to 12 mm where with a 12 mm distractor left on the left side thecal sac or respect on the right side. The space was reamed and then cut with 10 with a 12 mm tangent instruments. Soft tissues removed and interspace. A 12 x 26 over tangent wedge was then impacted into place and recessed roughly 2 mm from the posterior cortical margin of L3. Distractors in the patient's left side. Thecal sac or respect to the left side. The space was once again cut and then reamed with 12 mm tangent instruments. Soft tissues removed and interspace. The space for the curettage. Morselize autograft was packed into the interspace for later fusion. A 12 x 22 mm Telamon cage packed with morselized autograft was impacted in place and recessed approximately 2 mm from the posterior portal margin of L3. Pedicles of L3 and L4 were then identified using surface landmarks and intraoperative fluoroscopy. Superficial bone of overlying the pedicle was then removed using a high-speed drill. Pedicle was then probed using a pedicle awl each pedicle awl track was then probed and found to be solidly within bone. Each pedicle awl track was then tapped with a 5.25 screw tap. Each hole was probed and found to be solidly within bone. 6.75 x 45 mm radius screws placed bilaterally at L3 and L4. Transverse processes of L3 and L4 were decorticated with a high-speed drill. Morselized autograft packed posterolaterally for later fusion. Short segment titanium rod was then placed over the screw heads at L3 and L4. Locking caps and placed over the screw heads. Locking catching and engaged with the construct under compression. Final images revealed good position the bone graft and hardware proper level normal and the spine. Wound is then irrigated with and bike solution. Gelfoam was placed topically for hemostasis which then the beaded. Medium Hemovac drain was of the epidural space. Wounds and close in  layers with Vicryl sutures.  Steri-Strips and a sterile dressing were applied. There were no apparent complications. The patient tolerated the procedure well. He returns to the recovery room postop.

## 2011-08-02 NOTE — Brief Op Note (Signed)
08/02/2011  10:08 AM  PATIENT:  Jose Schmitt  71 y.o. male  PRE-OPERATIVE DIAGNOSIS:  listhesis / stenosis  POST-OPERATIVE DIAGNOSIS:  Listhesis/stenosis  PROCEDURE:  Procedure(s) (LRB): POSTERIOR LUMBAR FUSION 1 LEVEL (N/A)  SURGEON:  Surgeon(s) and Role:    * Temple Pacini, MD - Primary  PHYSICIAN ASSISTANT:   ASSISTANTS: Barnett Abu   ANESTHESIA:   general  EBL:  Total I/O In: 1000 [I.V.:1000] Out: 125 [Urine:125]  BLOOD ADMINISTERED:none  DRAINS: (Medium) Hemovact drain(s) in the Epidural space with  Suction Open   LOCAL MEDICATIONS USED:  MARCAINE     SPECIMEN:  No Specimen  DISPOSITION OF SPECIMEN:  N/A  COUNTS:  YES  TOURNIQUET:  * No tourniquets in log *  DICTATION: .Dragon Dictation  PLAN OF CARE: Admit to inpatient   PATIENT DISPOSITION:  PACU - hemodynamically stable.   Delay start of Pharmacological VTE agent (>24hrs) due to surgical blood loss or risk of bleeding: yes

## 2011-08-03 MED ORDER — DIAZEPAM 5 MG PO TABS: 5.0000 mg | ORAL_TABLET | Freq: Four times a day (QID) | ORAL | Status: AC | PRN

## 2011-08-03 MED ORDER — OXYCODONE-ACETAMINOPHEN 5-325 MG PO TABS: 1.0000 | ORAL_TABLET | ORAL | Status: AC | PRN

## 2011-08-03 NOTE — Evaluation (Signed)
Physical Therapy Evaluation Patient Details Name: Jose Schmitt MRN: [id number] DOB: 08/18/1940 Today's Date: 08/03/2011  Problem List:  Patient Active Problem List  Diagnoses  . Lumbar stenosis with neurogenic claudication  . Degenerative spondylolisthesis    Past Medical History:  Past Medical History  Diagnosis Date  . Hypertension     takes Lisinopril,Atenolol,Hctz daily  . Hyperlipidemia     takes Zocor nightly  . Hx of seasonal allergies   . Arthritis   . Chronic back pain     DDD and stenosis  . GERD (gastroesophageal reflux disease)     takes Zantac daily  . History of IBS   . Kidney stones     hx of   . Thyroid nodule   . Impaired hearing     right ear   Past Surgical History:  Past Surgical History  Procedure Date  . Tonsillectomy as a child  . Vasectomy   . Cataract surgery 4-60yrs ago    bilateral   . Mouth surgery     impacted wisdom teeth  . Colonoscopy   . Biopsy thyroid     PT Assessment/Plan/Recommendation PT Assessment Clinical Impression Statement: Patient is a 71 y.o. male s/p L3-L4 fusion with resulting pain, decreased mobility, and decreased balance.  Pt moving well and will have caregiver support at home, patient does not need acute physical therapy services at this time. PT Recommendation/Assessment: Patent does not need any further PT services No Skilled PT: Patient will have necessary level of assist by caregiver at discharge;Patient is supervision for all activity/mobility PT Recommendation Follow Up Recommendations: No PT follow up;Other (comment) (Follow-up with physician regarding outpatient PT services) Equipment Recommended: None recommended by PT PT Goals     PT Evaluation Precautions/Restrictions  Precautions Precautions: Back Precaution Booklet Issued: Yes (comment) Precaution Comments: Reviewed 3/3 back precautions. Required Braces or Orthoses: Yes Spinal Brace: Lumbar corset;Applied in sitting  position Restrictions Weight Bearing Restrictions: No Prior Functioning  Home Living Lives With: Spouse Receives Help From: Family Type of Home: House Home Layout: Two level;Able to live on main level with bedroom/bathroom Alternate Level Stairs-Rails: Left Alternate Level Stairs-Number of Steps: 12 Home Access: Stairs to enter Entrance Stairs-Rails: Can reach both Entrance Stairs-Number of Steps: 5 Bathroom Shower/Tub: Tub/shower unit;Walk-in shower;Curtain Bathroom Toilet: Standard Bathroom Accessibility: Yes How Accessible: Accessible via walker Home Adaptive Equipment: Walker - rolling;Bedside commode/3-in-1;Straight cane;Built-in shower seat Prior Function Level of Independence: Independent with basic ADLs;Independent with gait;Independent with transfers Able to Take Stairs?: Yes Driving: Yes Vocation: Retired Producer, television/film/video: Awake/alert Overall Cognitive Status: Appears within functional limits for tasks assessed Orientation Level: Oriented X4 Sensation/Coordination Sensation Light Touch: Appears Intact Additional Comments: Patient reports no numbness of tingling. Coordination Gross Motor Movements are Fluid and Coordinated: Yes Extremity Assessment   Mobility (including Balance) Bed Mobility Bed Mobility: Yes Rolling Left: 6: Modified independent (Device/Increase time);With rail Left Sidelying to Sit: 6: Modified independent (Device/Increase time);With rails;HOB elevated (comment degrees) Sitting - Scoot to Edge of Bed: 7: Independent Transfers Transfers: Yes Sit to Stand: Other (comment);From elevated surface;With upper extremity assist;From bed (Min guard) Stand to Sit: Other (comment);With upper extremity assist;To bed (Min guard) Ambulation/Gait Ambulation/Gait: Yes Ambulation/Gait Assistance: 6: Modified independent (Device/Increase time) Ambulation/Gait Assistance Details (indicate cue type and reason): Patient required supervision  for safety to follow back precautions. Ambulation Distance (Feet): 150 Feet Assistive device: Rolling walker Gait Pattern: Step-through pattern Stairs: Yes Stairs Assistance: 5: Supervision Stair Management Technique: Two rails;Step to pattern;Forwards Number of  Stairs: 10  Corporate treasurer: No  Posture/Postural Control Posture/Postural Control: No significant limitations Balance Balance Assessed: Yes Cabin crew Sitting - Balance Support: Feet supported Static Sitting - Level of Assistance: 7: Independent Static Sitting - Comment/# of Minutes: 5 minutes Static Standing Balance Static Standing - Balance Support: Bilateral upper extremity supported Static Standing - Level of Assistance: 5: Stand by assistance Static Standing - Comment/# of Minutes: 8 minutes Exercise    End of Session PT - End of Session Equipment Utilized During Treatment: Gait belt;Back brace Activity Tolerance: Patient tolerated treatment well Patient left: in bed;with call bell in reach;with family/visitor present (Sitting on edge of bed to eat breakfast) Nurse Communication: Mobility status for transfers;Mobility status for ambulation General Behavior During Session: [facility] for tasks performed Cognition: [facility] for tasks performed  Ezzard Standing SPT 08/03/2011, 9:30 AM

## 2011-08-03 NOTE — Progress Notes (Signed)
UR COMPLETED  

## 2011-08-03 NOTE — Discharge Instructions (Signed)
Wound Care °Keep incision covered and dry for one week.  If you shower prior to then, cover incision with plastic wrap.  °You may remove outer bandage after one week and shower.  °Do not put any creams, lotions, or ointments on incision. °Leave steri-strips on neck.  They will fall off by themselves. °Activity °Walk each and every day, increasing distance each day. °No lifting greater than 5 lbs.  Avoid excessive neck motion. °No driving for 2 weeks; may ride as a passenger locally. °If provided with back brace, wear when out of bed.  It is not necessary to wear brace in bed. °Diet °Resume your normal diet.  °Return to Work °Will be discussed at you follow up appointment. °Call Your Doctor If Any of These Occur °Redness, drainage, or swelling at the wound.  °Temperature greater than 101 degrees. °Severe pain not relieved by pain medication. °Incision starts to come apart. °Follow Up Appt °Call today for appointment in 1-2 weeks ([phone]) or for problems.  If you have any hardware placed in your spine, you will need an x-ray before your appointment. °

## 2011-08-03 NOTE — Evaluation (Signed)
Agree with student PT evaluation.  Cha Gomillion, PT DPT [phone]  

## 2011-08-03 NOTE — Evaluation (Signed)
Occupational Therapy Evaluation Patient Details Name: Jose Schmitt MRN: [id number] DOB: August 20, 1940 Today's Date: 08/03/2011 09:38-10:07  eI Problem List:  Patient Active Problem List  Diagnoses  . Lumbar stenosis with neurogenic claudication  . Degenerative spondylolisthesis    Past Medical History:  Past Medical History  Diagnosis Date  . Hypertension     takes Lisinopril,Atenolol,Hctz daily  . Hyperlipidemia     takes Zocor nightly  . Hx of seasonal allergies   . Arthritis   . Chronic back pain     DDD and stenosis  . GERD (gastroesophageal reflux disease)     takes Zantac daily  . History of IBS   . Kidney stones     hx of   . Thyroid nodule   . Impaired hearing     right ear   Past Surgical History:  Past Surgical History  Procedure Date  . Tonsillectomy as a child  . Vasectomy   . Cataract surgery 4-82yrs ago    bilateral   . Mouth surgery     impacted wisdom teeth  . Colonoscopy   . Biopsy thyroid     OT Assessment/Plan/Recommendation OT Assessment Clinical Impression Statement: Pleasant 71 yr old male admitted for elective lumbar laminectomy of L3-L4.  Overall supervision to modified independent with basic selfcare tasks and ADLs.  Will have 24 hour supervision from his wife and already has all OT related DME.  Feel pt does not warrant any further OT needs at this time. OT Recommendation/Assessment: Patient does not need any further OT services OT Recommendation Follow Up Recommendations: No OT follow up Equipment Recommended: None recommended by OT  OT Evaluation Precautions/Restrictions  Precautions Precautions: Back Precaution Booklet Issued: No Precaution Comments: Reviewed 3/3 back precautions. Required Braces or Orthoses: Yes Spinal Brace: Lumbar corset;Applied in sitting position Restrictions Weight Bearing Restrictions: No Prior Functioning Home Living Lives With: Spouse Receives Help From: Family Type of Home: House Home Layout: Two  level;Able to live on main level with bedroom/bathroom Alternate Level Stairs-Rails: Left Alternate Level Stairs-Number of Steps: 12 Home Access: Stairs to enter Entrance Stairs-Rails: Can reach both Entrance Stairs-Number of Steps: 5 Bathroom Shower/Tub: Tub/shower unit;Walk-in shower;Curtain Bathroom Toilet: Standard Bathroom Accessibility: Yes How Accessible: Accessible via walker Home Adaptive Equipment: Walker - rolling;Bedside commode/3-in-1;Straight cane;Built-in shower seat;Hand-held shower hose Prior Function Level of Independence: Independent with basic ADLs;Independent with gait;Independent with transfers Driving: Yes Vocation: Retired ADL ADL Eating/Feeding: Simulated;Independent Where Assessed - Eating/Feeding: Edge of bed Grooming: Simulated;Modified independent Where Assessed - Grooming: Standing at sink Upper Body Bathing: Simulated;Set up Where Assessed - Upper Body Bathing: Sitting in shower Lower Body Bathing: Simulated;Supervision/safety Where Assessed - Lower Body Bathing: Sit to stand from bed Upper Body Dressing: Performed;Modified independent (For lumbar corset) Where Assessed - Upper Body Dressing: Unsupported;Sitting, bed Lower Body Dressing: Performed;Modified independent Where Assessed - Lower Body Dressing: Sit to stand from bed Toilet Transfer: Performed;Modified independent Toilet Transfer Method: Proofreader: Comfort height toilet;Grab bars Toileting - Clothing Manipulation: Performed;Modified independent Where Assessed - Toileting Clothing Manipulation: Sit to stand from 3-in-1 or toilet Toileting - Hygiene: Simulated;Modified independent Where Assessed - Toileting Hygiene: Sit to stand from 3-in-1 or toilet Tub/Shower Transfer: Simulated;Supervision/safety Tub/Shower Transfer Method: Science writer: Grab bars;Walk in shower Equipment Used: Reacher;Sock aid;Rolling walker Ambulation Related to  ADLs: Pt overall modified independent for ambulation within and around his room using the RW.  ADL Comments: Pt overall supervision to modified independent.  Educated on back precautins for selfcare  tasks as well as availability of AE for LB selfcare.  Pt currently able to cross his LEs to reach his feet without difficulty but would benefit from a reacher for retreiving items from the floor.   Vision/Perception  Vision - History Baseline Vision: Wears glasses all the time Patient Visual Report: No change from baseline Vision - Assessment Eye Alignment: Within Functional Limits Perception Perception: Within Functional Limits Praxis Praxis: Intact Cognition Cognition Arousal/Alertness: Awake/alert Overall Cognitive Status: Appears within functional limits for tasks assessed Orientation Level: Oriented X4 Sensation/Coordination Sensation Light Touch: Appears Intact Stereognosis: Not tested Hot/Cold: Not tested Proprioception: Not tested Coordination Gross Motor Movements are Fluid and Coordinated: Yes Fine Motor Movements are Fluid and Coordinated: Yes Extremity Assessment RUE Assessment RUE Assessment: Within Functional Limits (Strength not formally tested secondary to back precautions.) LUE Assessment LUE Assessment: Within Functional Limits (Strength not formally tested secondary to back precautions) Mobility  Bed Mobility Bed Mobility: No Transfers Transfers: Yes Sit to Stand: 6: Modified independent (Device/Increase time);From bed;With upper extremity assist  End of Session OT - End of Session Equipment Utilized During Treatment: Back brace Activity Tolerance: Patient tolerated treatment well Patient left: in bed;with call bell in reach;with family/visitor present Nurse Communication: Mobility status for transfers General Behavior During Session: [facility] for tasks performed Cognition: [facility] for tasks performed   Morell Mears OTR/L 08/03/2011, 1:23 PM  Pager number  [phone]

## 2011-08-03 NOTE — Discharge Summary (Signed)
Physician Discharge Summary  Patient ID: Jose Schmitt MRN: [id number] DOB/AGE: August 17, 1940 71 y.o.  Admit date: 08/02/2011 Discharge date: 08/03/2011  Admission Diagnoses:  Discharge Diagnoses:  Principal Problem:  *Lumbar stenosis with neurogenic claudication Active Problems:  Degenerative spondylolisthesis   Discharged Condition: good  Hospital Course: Patient in the hospital oriented when an uncomplicated L3-4 decompression and fusion. Postoperative patient is done well. He said resolution of his preoperative lower extremity pain. Back pain is well controlled. He's ambulate without difficulty. Tolerating regular diet. Bowel and bladder function working normally. Plan for discharge home.  Consults:   Significant Diagnostic Studies:   Treatments:   Discharge Exam: Blood pressure 146/67, pulse 78, temperature 98.1 F (36.7 C), temperature source Oral, resp. rate 20, SpO2 97.00%. Awake and alert oriented appropriate. Cranial nerve function is intact. Motor and sensory function and extremities is normal. Wound healing well. Chest and abdomen benign.  Disposition: Final discharge disposition not confirmed   Medication List  As of 08/03/2011  9:02 AM   TAKE these medications         aspirin 325 MG tablet   Take 325 mg by mouth daily.      atenolol 50 MG tablet   Commonly known as: TENORMIN   Take 50 mg by mouth daily.      diazepam 5 MG tablet   Commonly known as: VALIUM   Take 1-2 tablets (5-10 mg total) by mouth every 6 (six) hours as needed (spasms).      fish oil-omega-3 fatty acids 1000 MG capsule   Take 1 g by mouth 2 (two) times daily.      hydrochlorothiazide 25 MG tablet   Commonly known as: HYDRODIURIL   Take 25 mg by mouth daily.      lisinopril 20 MG tablet   Commonly known as: PRINIVIL,ZESTRIL   Take 20 mg by mouth daily.      naproxen sodium 220 MG tablet   Commonly known as: ANAPROX   Take 220-440 mg by mouth 2 (two) times daily as needed. For knee  pain      oxyCODONE-acetaminophen 5-325 MG per tablet   Commonly known as: PERCOCET   Take 1-2 tablets by mouth every 4 (four) hours as needed.      ranitidine 150 MG tablet   Commonly known as: ZANTAC   Take 150 mg by mouth daily.      simvastatin 80 MG tablet   Commonly known as: ZOCOR   Take 80 mg by mouth at bedtime.           Follow-up Information    Follow up with Quanell Loughney A, MD. Call in 1 week.   Contact information:   [address] [place] [address] [phone]          Signed: Temple Pacini 08/03/2011, 9:02 AM

## 2011-08-08 MED FILL — Heparin Sodium (Porcine) Inj 1000 Unit/ML: INTRAMUSCULAR | Qty: 30 | Status: AC

## 2011-08-08 MED FILL — Sodium Chloride Irrigation Soln 0.9%: Qty: 3000 | Status: AC

## 2011-08-08 MED FILL — Sodium Chloride IV Soln 0.9%: INTRAVENOUS | Qty: 1000 | Status: AC

## 2011-08-11 DIAGNOSIS — M545 Low back pain: Secondary | ICD-10-CM | POA: Diagnosis not present

## 2011-09-01 DIAGNOSIS — M545 Low back pain: Secondary | ICD-10-CM | POA: Diagnosis not present

## 2011-10-04 DIAGNOSIS — M545 Low back pain: Secondary | ICD-10-CM | POA: Diagnosis not present

## 2011-10-05 DIAGNOSIS — H11159 Pinguecula, unspecified eye: Secondary | ICD-10-CM | POA: Diagnosis not present

## 2011-10-05 DIAGNOSIS — H179 Unspecified corneal scar and opacity: Secondary | ICD-10-CM | POA: Diagnosis not present

## 2011-10-24 DIAGNOSIS — L821 Other seborrheic keratosis: Secondary | ICD-10-CM | POA: Diagnosis not present

## 2011-10-24 DIAGNOSIS — D235 Other benign neoplasm of skin of trunk: Secondary | ICD-10-CM | POA: Diagnosis not present

## 2011-10-24 DIAGNOSIS — L570 Actinic keratosis: Secondary | ICD-10-CM | POA: Diagnosis not present

## 2011-10-24 DIAGNOSIS — L820 Inflamed seborrheic keratosis: Secondary | ICD-10-CM | POA: Diagnosis not present

## 2011-11-03 DIAGNOSIS — M545 Low back pain: Secondary | ICD-10-CM | POA: Diagnosis not present

## 2011-11-07 DIAGNOSIS — M545 Low back pain: Secondary | ICD-10-CM | POA: Diagnosis not present

## 2011-11-09 DIAGNOSIS — M545 Low back pain: Secondary | ICD-10-CM | POA: Diagnosis not present

## 2011-11-14 DIAGNOSIS — M545 Low back pain: Secondary | ICD-10-CM | POA: Diagnosis not present

## 2011-11-16 DIAGNOSIS — M545 Low back pain: Secondary | ICD-10-CM | POA: Diagnosis not present

## 2011-11-22 DIAGNOSIS — M545 Low back pain: Secondary | ICD-10-CM | POA: Diagnosis not present

## 2011-11-24 DIAGNOSIS — M545 Low back pain: Secondary | ICD-10-CM | POA: Diagnosis not present

## 2011-11-28 DIAGNOSIS — M545 Low back pain: Secondary | ICD-10-CM | POA: Diagnosis not present

## 2011-11-30 DIAGNOSIS — M545 Low back pain: Secondary | ICD-10-CM | POA: Diagnosis not present

## 2011-12-01 DIAGNOSIS — M545 Low back pain: Secondary | ICD-10-CM | POA: Diagnosis not present

## 2011-12-14 DIAGNOSIS — M545 Low back pain: Secondary | ICD-10-CM | POA: Diagnosis not present

## 2012-02-07 DIAGNOSIS — Z961 Presence of intraocular lens: Secondary | ICD-10-CM | POA: Diagnosis not present

## 2012-02-07 DIAGNOSIS — H26499 Other secondary cataract, unspecified eye: Secondary | ICD-10-CM | POA: Diagnosis not present

## 2012-02-21 DIAGNOSIS — Z961 Presence of intraocular lens: Secondary | ICD-10-CM | POA: Diagnosis not present

## 2012-02-24 DIAGNOSIS — Z23 Encounter for immunization: Secondary | ICD-10-CM | POA: Diagnosis not present

## 2012-02-28 DIAGNOSIS — H40019 Open angle with borderline findings, low risk, unspecified eye: Secondary | ICD-10-CM | POA: Diagnosis not present

## 2012-04-17 DIAGNOSIS — H40009 Preglaucoma, unspecified, unspecified eye: Secondary | ICD-10-CM | POA: Diagnosis not present

## 2012-05-02 DIAGNOSIS — L570 Actinic keratosis: Secondary | ICD-10-CM | POA: Diagnosis not present

## 2012-05-02 DIAGNOSIS — L820 Inflamed seborrheic keratosis: Secondary | ICD-10-CM | POA: Diagnosis not present

## 2012-05-28 DIAGNOSIS — Z125 Encounter for screening for malignant neoplasm of prostate: Secondary | ICD-10-CM | POA: Diagnosis not present

## 2012-05-28 DIAGNOSIS — I10 Essential (primary) hypertension: Secondary | ICD-10-CM | POA: Diagnosis not present

## 2012-10-18 DIAGNOSIS — H40019 Open angle with borderline findings, low risk, unspecified eye: Secondary | ICD-10-CM | POA: Diagnosis not present

## 2012-10-31 DIAGNOSIS — L570 Actinic keratosis: Secondary | ICD-10-CM | POA: Diagnosis not present

## 2013-02-21 DIAGNOSIS — Z23 Encounter for immunization: Secondary | ICD-10-CM | POA: Diagnosis not present

## 2013-04-25 DIAGNOSIS — H4011X0 Primary open-angle glaucoma, stage unspecified: Secondary | ICD-10-CM | POA: Diagnosis not present

## 2013-04-25 DIAGNOSIS — H52229 Regular astigmatism, unspecified eye: Secondary | ICD-10-CM | POA: Diagnosis not present

## 2013-04-25 DIAGNOSIS — H5200 Hypermetropia, unspecified eye: Secondary | ICD-10-CM | POA: Diagnosis not present

## 2013-04-25 DIAGNOSIS — H524 Presbyopia: Secondary | ICD-10-CM | POA: Diagnosis not present

## 2013-05-29 DIAGNOSIS — H4011X0 Primary open-angle glaucoma, stage unspecified: Secondary | ICD-10-CM | POA: Diagnosis not present

## 2013-05-29 DIAGNOSIS — H409 Unspecified glaucoma: Secondary | ICD-10-CM | POA: Diagnosis not present

## 2013-07-10 DIAGNOSIS — H4011X0 Primary open-angle glaucoma, stage unspecified: Secondary | ICD-10-CM | POA: Diagnosis not present

## 2013-09-06 DIAGNOSIS — E042 Nontoxic multinodular goiter: Secondary | ICD-10-CM | POA: Diagnosis not present

## 2013-09-06 DIAGNOSIS — Z23 Encounter for immunization: Secondary | ICD-10-CM | POA: Diagnosis not present

## 2013-09-06 DIAGNOSIS — R7301 Impaired fasting glucose: Secondary | ICD-10-CM | POA: Diagnosis not present

## 2013-09-06 DIAGNOSIS — R29898 Other symptoms and signs involving the musculoskeletal system: Secondary | ICD-10-CM | POA: Diagnosis not present

## 2013-09-06 DIAGNOSIS — I10 Essential (primary) hypertension: Secondary | ICD-10-CM | POA: Diagnosis not present

## 2013-09-06 DIAGNOSIS — E782 Mixed hyperlipidemia: Secondary | ICD-10-CM | POA: Diagnosis not present

## 2013-09-20 DIAGNOSIS — R7301 Impaired fasting glucose: Secondary | ICD-10-CM | POA: Diagnosis not present

## 2013-09-20 DIAGNOSIS — I10 Essential (primary) hypertension: Secondary | ICD-10-CM | POA: Diagnosis not present

## 2013-09-20 DIAGNOSIS — E782 Mixed hyperlipidemia: Secondary | ICD-10-CM | POA: Diagnosis not present

## 2013-09-20 DIAGNOSIS — E042 Nontoxic multinodular goiter: Secondary | ICD-10-CM | POA: Diagnosis not present

## 2013-10-03 DIAGNOSIS — E538 Deficiency of other specified B group vitamins: Secondary | ICD-10-CM | POA: Diagnosis not present

## 2013-10-11 DIAGNOSIS — E538 Deficiency of other specified B group vitamins: Secondary | ICD-10-CM | POA: Diagnosis not present

## 2013-10-28 DIAGNOSIS — L820 Inflamed seborrheic keratosis: Secondary | ICD-10-CM | POA: Diagnosis not present

## 2013-10-28 DIAGNOSIS — L570 Actinic keratosis: Secondary | ICD-10-CM | POA: Diagnosis not present

## 2013-10-28 DIAGNOSIS — D235 Other benign neoplasm of skin of trunk: Secondary | ICD-10-CM | POA: Diagnosis not present

## 2013-11-13 DIAGNOSIS — H4011X0 Primary open-angle glaucoma, stage unspecified: Secondary | ICD-10-CM | POA: Diagnosis not present

## 2013-11-13 DIAGNOSIS — H409 Unspecified glaucoma: Secondary | ICD-10-CM | POA: Diagnosis not present

## 2014-01-27 IMAGING — CR DG CHEST 2V
2 series · 2 of 2 positions shown · non-contrast
Comparison: None.

CLINICAL DATA: PLIF.

CHEST - 2 VIEW

[view not recorded (1 of 2)]
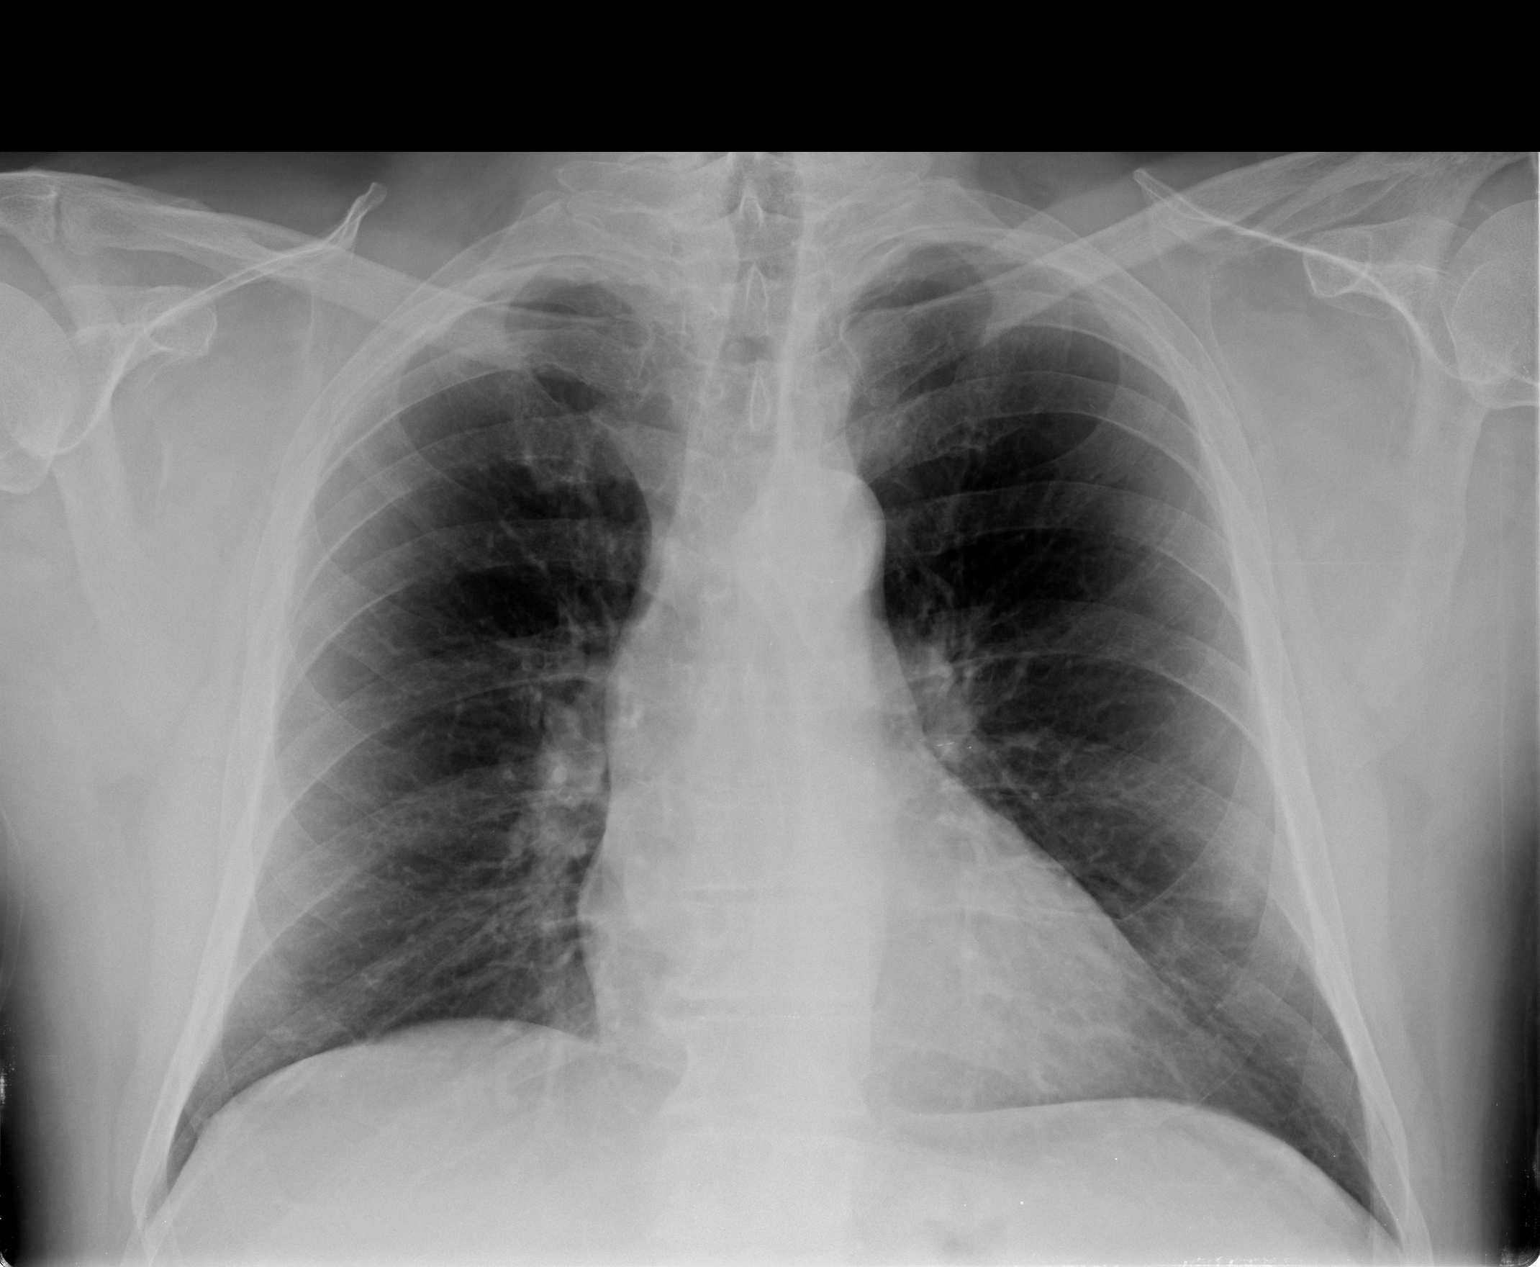

[view not recorded (2 of 2)]
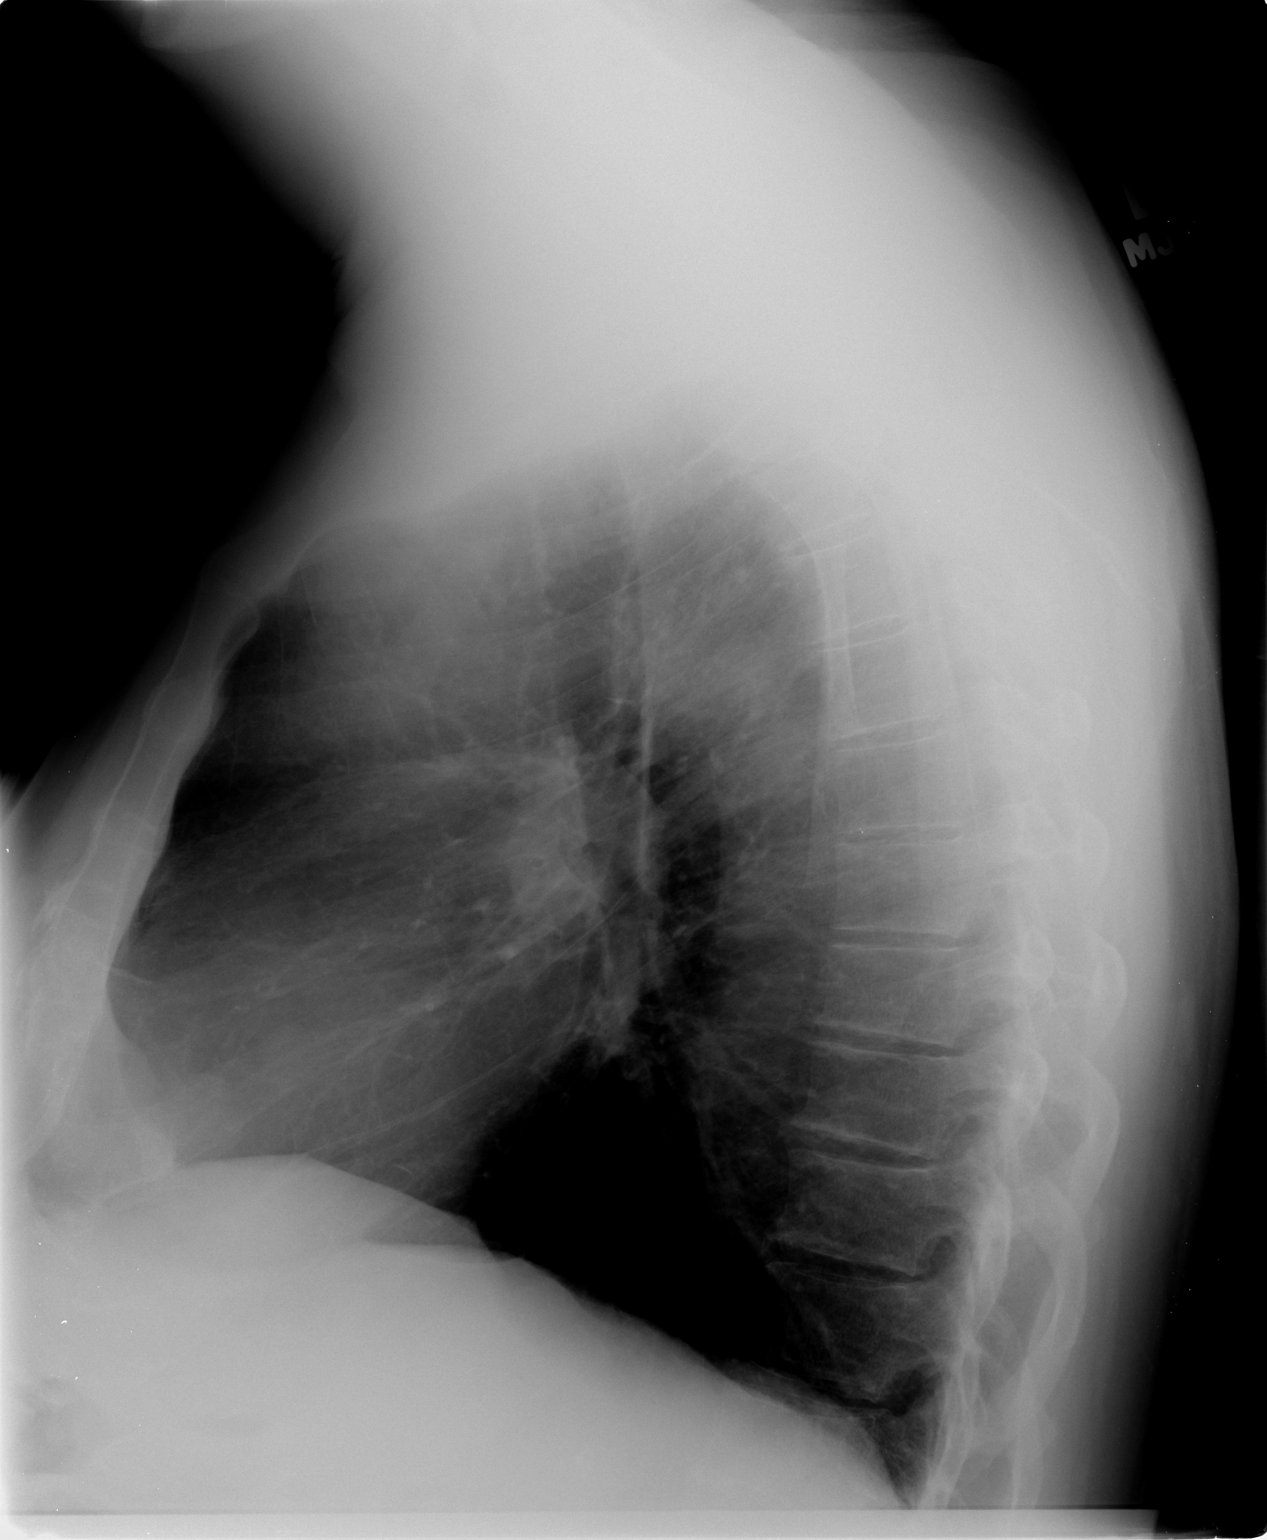

[2 of 2 positions shown; findings below may reference images not displayed]

FINDINGS: Normal heart, mediastinal, and hilar contours.  The
pulmonary vascularity is normal.  There is no airspace disease or
pleural effusion. No acute bony abnormality.
IMPRESSION: No acute cardiopulmonary disease.

## 2014-02-12 DIAGNOSIS — Z23 Encounter for immunization: Secondary | ICD-10-CM | POA: Diagnosis not present

## 2014-04-22 DIAGNOSIS — H11153 Pinguecula, bilateral: Secondary | ICD-10-CM | POA: Diagnosis not present

## 2014-04-22 DIAGNOSIS — Z961 Presence of intraocular lens: Secondary | ICD-10-CM | POA: Diagnosis not present

## 2014-04-22 DIAGNOSIS — H33302 Unspecified retinal break, left eye: Secondary | ICD-10-CM | POA: Diagnosis not present

## 2014-04-22 DIAGNOSIS — H52223 Regular astigmatism, bilateral: Secondary | ICD-10-CM | POA: Diagnosis not present

## 2014-04-22 DIAGNOSIS — H40012 Open angle with borderline findings, low risk, left eye: Secondary | ICD-10-CM | POA: Diagnosis not present

## 2014-04-22 DIAGNOSIS — H4011X1 Primary open-angle glaucoma, mild stage: Secondary | ICD-10-CM | POA: Diagnosis not present

## 2014-04-22 DIAGNOSIS — H5203 Hypermetropia, bilateral: Secondary | ICD-10-CM | POA: Diagnosis not present

## 2014-04-22 DIAGNOSIS — H04123 Dry eye syndrome of bilateral lacrimal glands: Secondary | ICD-10-CM | POA: Diagnosis not present

## 2014-05-08 ENCOUNTER — Other Ambulatory Visit: Payer: Self-pay | Admitting: Internal Medicine

## 2014-05-08 DIAGNOSIS — E042 Nontoxic multinodular goiter: Secondary | ICD-10-CM

## 2014-05-22 DIAGNOSIS — E538 Deficiency of other specified B group vitamins: Secondary | ICD-10-CM | POA: Diagnosis not present

## 2014-05-22 DIAGNOSIS — E040 Nontoxic diffuse goiter: Secondary | ICD-10-CM | POA: Diagnosis not present

## 2014-05-28 ENCOUNTER — Ambulatory Visit
Admission: RE | Admit: 2014-05-28 | Discharge: 2014-05-28 | Disposition: A | Discharge status: Home / Self Care | Payer: Medicare Other | Source: Ambulatory Visit | Attending: Internal Medicine | Admitting: Internal Medicine

## 2014-05-28 DIAGNOSIS — E042 Nontoxic multinodular goiter: Secondary | ICD-10-CM

## 2014-05-28 DIAGNOSIS — H4010X2 Unspecified open-angle glaucoma, moderate stage: Secondary | ICD-10-CM | POA: Diagnosis not present

## 2014-07-08 DIAGNOSIS — E042 Nontoxic multinodular goiter: Secondary | ICD-10-CM | POA: Diagnosis not present

## 2014-10-08 DIAGNOSIS — E538 Deficiency of other specified B group vitamins: Secondary | ICD-10-CM | POA: Diagnosis not present

## 2014-10-08 DIAGNOSIS — N200 Calculus of kidney: Secondary | ICD-10-CM | POA: Diagnosis not present

## 2014-10-08 DIAGNOSIS — R7301 Impaired fasting glucose: Secondary | ICD-10-CM | POA: Diagnosis not present

## 2014-10-08 DIAGNOSIS — Z125 Encounter for screening for malignant neoplasm of prostate: Secondary | ICD-10-CM | POA: Diagnosis not present

## 2014-10-08 DIAGNOSIS — E042 Nontoxic multinodular goiter: Secondary | ICD-10-CM | POA: Diagnosis not present

## 2014-10-08 DIAGNOSIS — I10 Essential (primary) hypertension: Secondary | ICD-10-CM | POA: Diagnosis not present

## 2014-10-08 DIAGNOSIS — E782 Mixed hyperlipidemia: Secondary | ICD-10-CM | POA: Diagnosis not present

## 2014-10-10 DIAGNOSIS — Z125 Encounter for screening for malignant neoplasm of prostate: Secondary | ICD-10-CM | POA: Diagnosis not present

## 2014-10-10 DIAGNOSIS — I10 Essential (primary) hypertension: Secondary | ICD-10-CM | POA: Diagnosis not present

## 2014-11-26 DIAGNOSIS — H4011X1 Primary open-angle glaucoma, mild stage: Secondary | ICD-10-CM | POA: Diagnosis not present

## 2015-02-25 DIAGNOSIS — H04122 Dry eye syndrome of left lacrimal gland: Secondary | ICD-10-CM | POA: Diagnosis not present

## 2015-02-25 DIAGNOSIS — H5202 Hypermetropia, left eye: Secondary | ICD-10-CM | POA: Diagnosis not present

## 2015-02-25 DIAGNOSIS — H401111 Primary open-angle glaucoma, right eye, mild stage: Secondary | ICD-10-CM | POA: Diagnosis not present

## 2015-02-25 DIAGNOSIS — H16142 Punctate keratitis, left eye: Secondary | ICD-10-CM | POA: Diagnosis not present

## 2015-02-25 DIAGNOSIS — H52222 Regular astigmatism, left eye: Secondary | ICD-10-CM | POA: Diagnosis not present

## 2015-02-25 DIAGNOSIS — H401121 Primary open-angle glaucoma, left eye, mild stage: Secondary | ICD-10-CM | POA: Diagnosis not present

## 2015-03-06 DIAGNOSIS — Z23 Encounter for immunization: Secondary | ICD-10-CM | POA: Diagnosis not present

## 2015-03-18 DIAGNOSIS — H401121 Primary open-angle glaucoma, left eye, mild stage: Secondary | ICD-10-CM | POA: Diagnosis not present

## 2015-03-18 DIAGNOSIS — H52223 Regular astigmatism, bilateral: Secondary | ICD-10-CM | POA: Diagnosis not present

## 2015-03-18 DIAGNOSIS — H401111 Primary open-angle glaucoma, right eye, mild stage: Secondary | ICD-10-CM | POA: Diagnosis not present

## 2015-03-18 DIAGNOSIS — H5203 Hypermetropia, bilateral: Secondary | ICD-10-CM | POA: Diagnosis not present

## 2015-03-18 DIAGNOSIS — Z961 Presence of intraocular lens: Secondary | ICD-10-CM | POA: Diagnosis not present

## 2015-07-10 DIAGNOSIS — Z961 Presence of intraocular lens: Secondary | ICD-10-CM | POA: Diagnosis not present

## 2015-07-10 DIAGNOSIS — H52221 Regular astigmatism, right eye: Secondary | ICD-10-CM | POA: Diagnosis not present

## 2015-07-10 DIAGNOSIS — H401111 Primary open-angle glaucoma, right eye, mild stage: Secondary | ICD-10-CM | POA: Diagnosis not present

## 2015-07-10 DIAGNOSIS — H401121 Primary open-angle glaucoma, left eye, mild stage: Secondary | ICD-10-CM | POA: Diagnosis not present

## 2015-07-10 DIAGNOSIS — H5201 Hypermetropia, right eye: Secondary | ICD-10-CM | POA: Diagnosis not present

## 2015-07-10 DIAGNOSIS — H26491 Other secondary cataract, right eye: Secondary | ICD-10-CM | POA: Diagnosis not present

## 2015-07-24 DIAGNOSIS — Z961 Presence of intraocular lens: Secondary | ICD-10-CM | POA: Diagnosis not present

## 2015-07-24 DIAGNOSIS — H18413 Arcus senilis, bilateral: Secondary | ICD-10-CM | POA: Diagnosis not present

## 2015-07-24 DIAGNOSIS — H26491 Other secondary cataract, right eye: Secondary | ICD-10-CM | POA: Diagnosis not present

## 2015-07-31 DIAGNOSIS — H5203 Hypermetropia, bilateral: Secondary | ICD-10-CM | POA: Diagnosis not present

## 2015-07-31 DIAGNOSIS — Z9849 Cataract extraction status, unspecified eye: Secondary | ICD-10-CM | POA: Diagnosis not present

## 2015-07-31 DIAGNOSIS — Z961 Presence of intraocular lens: Secondary | ICD-10-CM | POA: Diagnosis not present

## 2015-07-31 DIAGNOSIS — H52223 Regular astigmatism, bilateral: Secondary | ICD-10-CM | POA: Diagnosis not present

## 2015-08-19 DIAGNOSIS — H52223 Regular astigmatism, bilateral: Secondary | ICD-10-CM | POA: Diagnosis not present

## 2015-08-19 DIAGNOSIS — H401111 Primary open-angle glaucoma, right eye, mild stage: Secondary | ICD-10-CM | POA: Diagnosis not present

## 2015-08-19 DIAGNOSIS — H401121 Primary open-angle glaucoma, left eye, mild stage: Secondary | ICD-10-CM | POA: Diagnosis not present

## 2015-08-19 DIAGNOSIS — H5203 Hypermetropia, bilateral: Secondary | ICD-10-CM | POA: Diagnosis not present

## 2015-12-29 DIAGNOSIS — Z125 Encounter for screening for malignant neoplasm of prostate: Secondary | ICD-10-CM | POA: Diagnosis not present

## 2015-12-29 DIAGNOSIS — I10 Essential (primary) hypertension: Secondary | ICD-10-CM | POA: Diagnosis not present

## 2015-12-30 DIAGNOSIS — R7301 Impaired fasting glucose: Secondary | ICD-10-CM | POA: Diagnosis not present

## 2015-12-30 DIAGNOSIS — Z23 Encounter for immunization: Secondary | ICD-10-CM | POA: Diagnosis not present

## 2015-12-30 DIAGNOSIS — E538 Deficiency of other specified B group vitamins: Secondary | ICD-10-CM | POA: Diagnosis not present

## 2015-12-30 DIAGNOSIS — E782 Mixed hyperlipidemia: Secondary | ICD-10-CM | POA: Diagnosis not present

## 2015-12-30 DIAGNOSIS — I10 Essential (primary) hypertension: Secondary | ICD-10-CM | POA: Diagnosis not present

## 2015-12-30 DIAGNOSIS — E042 Nontoxic multinodular goiter: Secondary | ICD-10-CM | POA: Diagnosis not present

## 2016-01-20 DIAGNOSIS — H52223 Regular astigmatism, bilateral: Secondary | ICD-10-CM | POA: Diagnosis not present

## 2016-01-20 DIAGNOSIS — I10 Essential (primary) hypertension: Secondary | ICD-10-CM | POA: Diagnosis not present

## 2016-01-20 DIAGNOSIS — H35033 Hypertensive retinopathy, bilateral: Secondary | ICD-10-CM | POA: Diagnosis not present

## 2016-01-20 DIAGNOSIS — H401122 Primary open-angle glaucoma, left eye, moderate stage: Secondary | ICD-10-CM | POA: Diagnosis not present

## 2016-01-20 DIAGNOSIS — H47021 Hemorrhage in optic nerve sheath, right eye: Secondary | ICD-10-CM | POA: Diagnosis not present

## 2016-01-20 DIAGNOSIS — H401111 Primary open-angle glaucoma, right eye, mild stage: Secondary | ICD-10-CM | POA: Diagnosis not present

## 2016-01-20 DIAGNOSIS — H5203 Hypermetropia, bilateral: Secondary | ICD-10-CM | POA: Diagnosis not present

## 2016-01-20 DIAGNOSIS — H5340 Unspecified visual field defects: Secondary | ICD-10-CM | POA: Diagnosis not present

## 2016-01-20 DIAGNOSIS — H401191 Primary open-angle glaucoma, unspecified eye, mild stage: Secondary | ICD-10-CM | POA: Diagnosis not present

## 2016-02-23 DIAGNOSIS — M5416 Radiculopathy, lumbar region: Secondary | ICD-10-CM | POA: Diagnosis not present

## 2016-03-01 ENCOUNTER — Other Ambulatory Visit: Payer: Self-pay | Admitting: Neurosurgery

## 2016-03-01 DIAGNOSIS — M5416 Radiculopathy, lumbar region: Secondary | ICD-10-CM

## 2016-03-14 ENCOUNTER — Other Ambulatory Visit: Payer: BLUE CROSS/BLUE SHIELD

## 2016-03-21 ENCOUNTER — Ambulatory Visit
Admission: RE | Admit: 2016-03-21 | Discharge: 2016-03-21 | Disposition: A | Discharge status: Home / Self Care | Payer: Medicare Other | Source: Ambulatory Visit | Attending: Neurosurgery | Admitting: Neurosurgery

## 2016-03-21 DIAGNOSIS — M5416 Radiculopathy, lumbar region: Secondary | ICD-10-CM

## 2016-03-21 DIAGNOSIS — M48061 Spinal stenosis, lumbar region without neurogenic claudication: Secondary | ICD-10-CM | POA: Diagnosis not present

## 2016-03-21 MED ORDER — GADOBENATE DIMEGLUMINE 529 MG/ML IV SOLN
18.0000 mL | Freq: Once | INTRAVENOUS | Status: AC | PRN
  Administered 2016-03-21: 18 mL via INTRAVENOUS

## 2016-03-24 DIAGNOSIS — M4310 Spondylolisthesis, site unspecified: Secondary | ICD-10-CM | POA: Diagnosis not present

## 2016-03-24 DIAGNOSIS — M5416 Radiculopathy, lumbar region: Secondary | ICD-10-CM | POA: Diagnosis not present

## 2016-04-21 DIAGNOSIS — M5416 Radiculopathy, lumbar region: Secondary | ICD-10-CM | POA: Diagnosis not present

## 2016-04-21 DIAGNOSIS — M4310 Spondylolisthesis, site unspecified: Secondary | ICD-10-CM | POA: Diagnosis not present

## 2016-05-18 DIAGNOSIS — H401122 Primary open-angle glaucoma, left eye, moderate stage: Secondary | ICD-10-CM | POA: Diagnosis not present

## 2016-05-18 DIAGNOSIS — I10 Essential (primary) hypertension: Secondary | ICD-10-CM | POA: Diagnosis not present

## 2016-05-18 DIAGNOSIS — H401111 Primary open-angle glaucoma, right eye, mild stage: Secondary | ICD-10-CM | POA: Diagnosis not present

## 2016-05-18 DIAGNOSIS — H35033 Hypertensive retinopathy, bilateral: Secondary | ICD-10-CM | POA: Diagnosis not present

## 2016-05-18 DIAGNOSIS — H401191 Primary open-angle glaucoma, unspecified eye, mild stage: Secondary | ICD-10-CM | POA: Diagnosis not present

## 2016-05-18 DIAGNOSIS — H47021 Hemorrhage in optic nerve sheath, right eye: Secondary | ICD-10-CM | POA: Diagnosis not present

## 2016-05-25 DIAGNOSIS — M5416 Radiculopathy, lumbar region: Secondary | ICD-10-CM | POA: Diagnosis not present

## 2016-05-25 DIAGNOSIS — Z6828 Body mass index (BMI) 28.0-28.9, adult: Secondary | ICD-10-CM | POA: Diagnosis not present

## 2016-05-25 DIAGNOSIS — M4310 Spondylolisthesis, site unspecified: Secondary | ICD-10-CM | POA: Diagnosis not present

## 2016-08-08 DIAGNOSIS — L820 Inflamed seborrheic keratosis: Secondary | ICD-10-CM | POA: Diagnosis not present

## 2016-08-08 DIAGNOSIS — L0202 Furuncle of face: Secondary | ICD-10-CM | POA: Diagnosis not present

## 2016-08-08 DIAGNOSIS — X32XXXD Exposure to sunlight, subsequent encounter: Secondary | ICD-10-CM | POA: Diagnosis not present

## 2016-08-08 DIAGNOSIS — L570 Actinic keratosis: Secondary | ICD-10-CM | POA: Diagnosis not present

## 2016-08-24 DIAGNOSIS — H401131 Primary open-angle glaucoma, bilateral, mild stage: Secondary | ICD-10-CM | POA: Diagnosis not present

## 2016-11-29 IMAGING — US US SOFT TISSUE HEAD/NECK
1 series · 14 of 25 positions shown · non-contrast
Comparison: 04/27/2011

CLINICAL DATA: Multi nodular goiter

EXAM:
THYROID ULTRASOUND
TECHNIQUE: Ultrasound examination of the thyroid gland and adjacent soft
tissues was performed.

[Series 1: us soft tissue head/neck · 0.08mm/px · 14 of 74 slices shown]
[im 1/74]
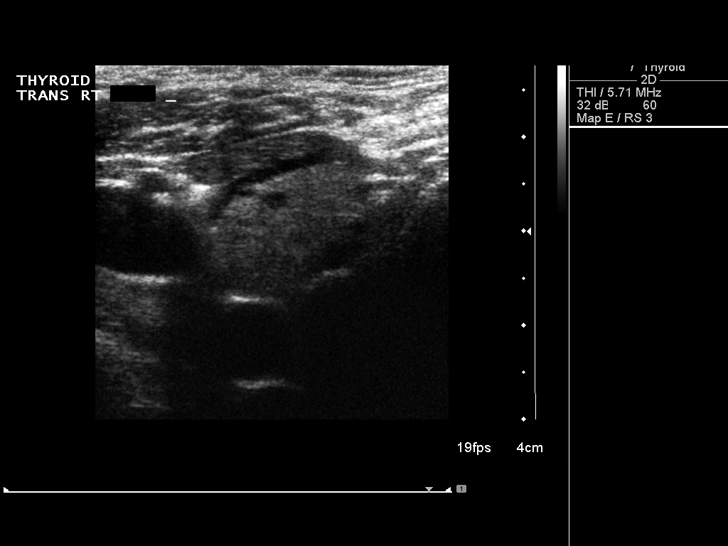
[im 7/74]
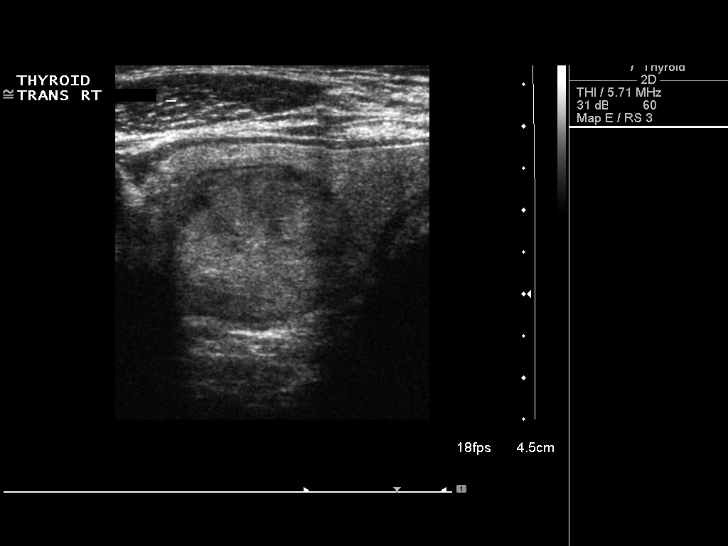
[im 13/74]
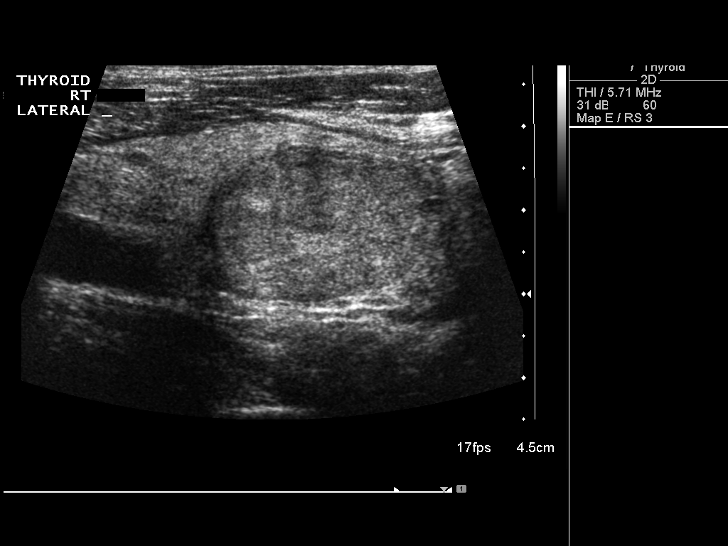
[im 19/74]
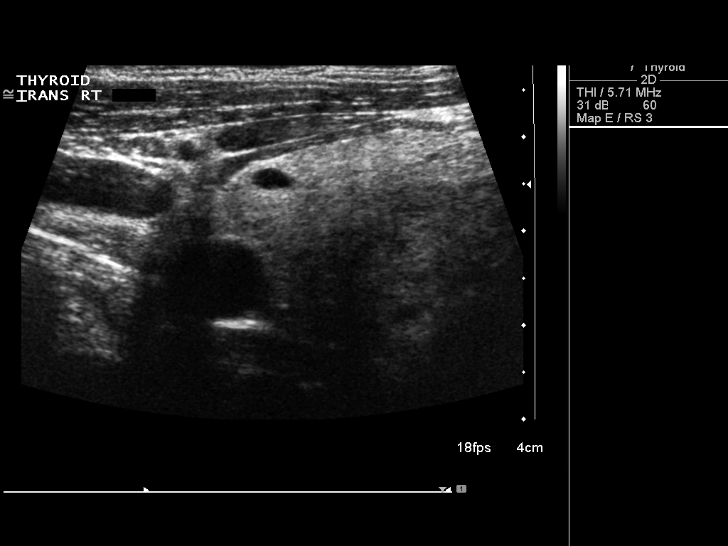
[im 25/74]
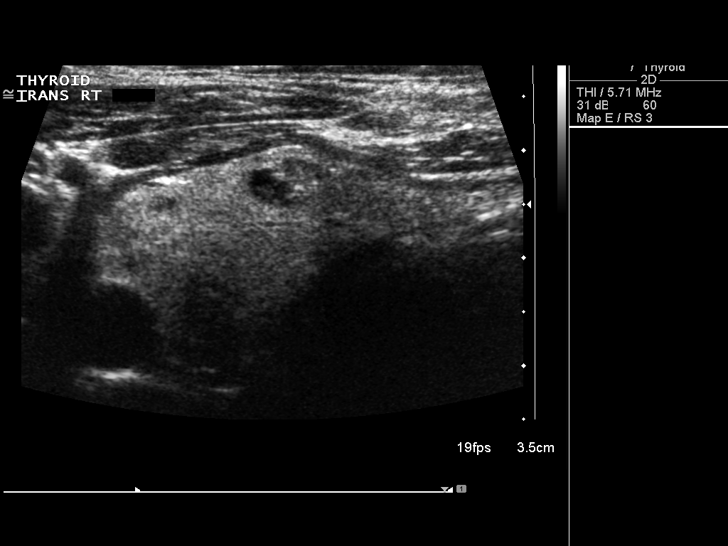
[im 28/74]
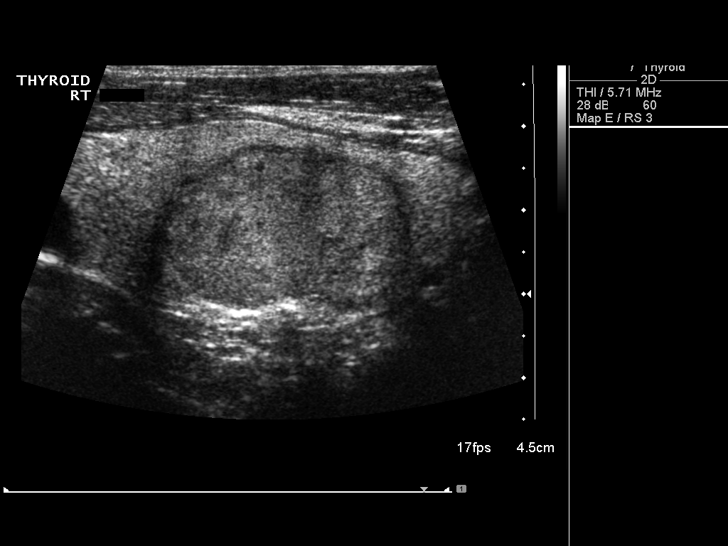
[im 34/74]
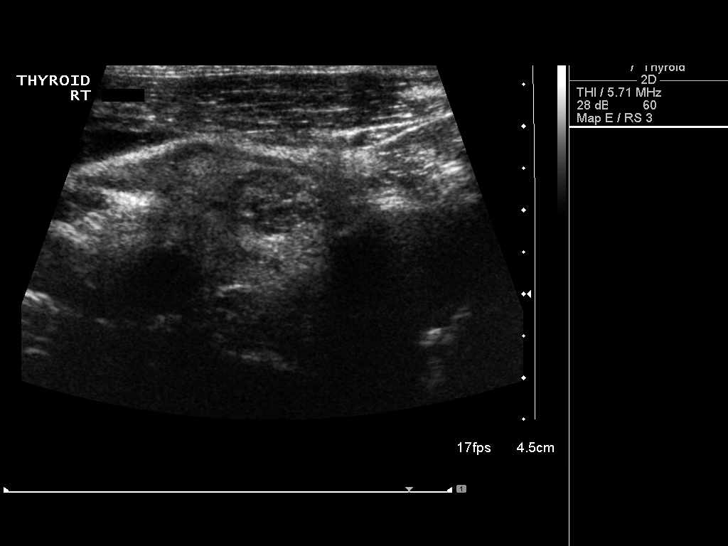
[im 40/74]
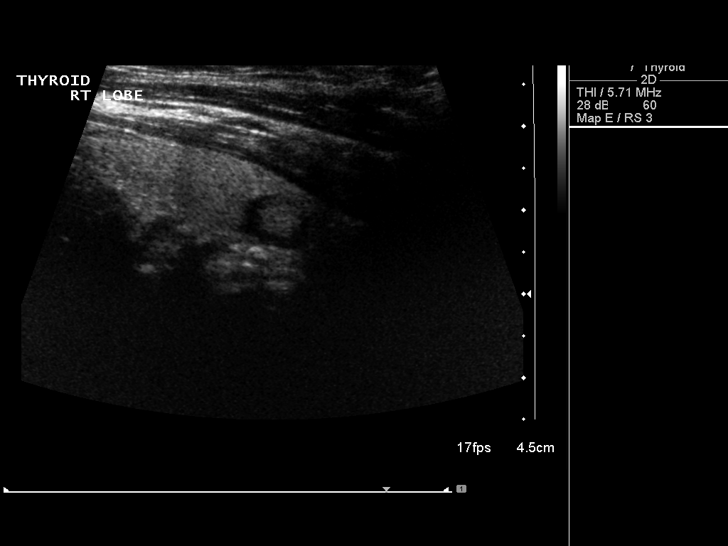
[im 46/74]
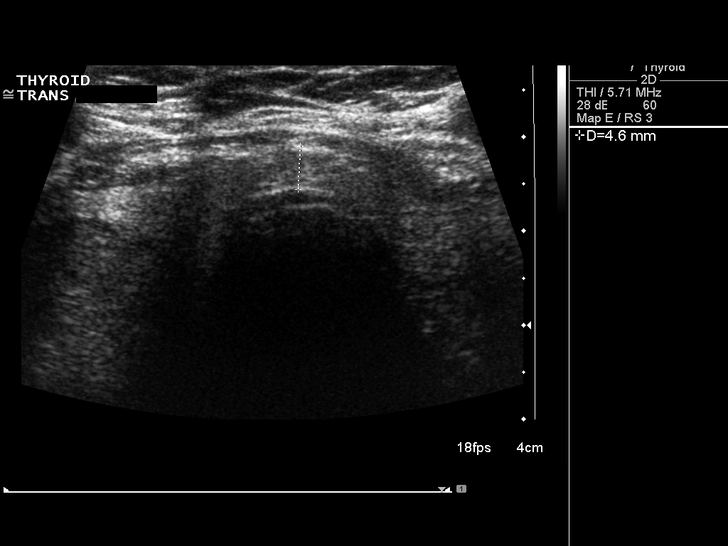
[im 49/74]
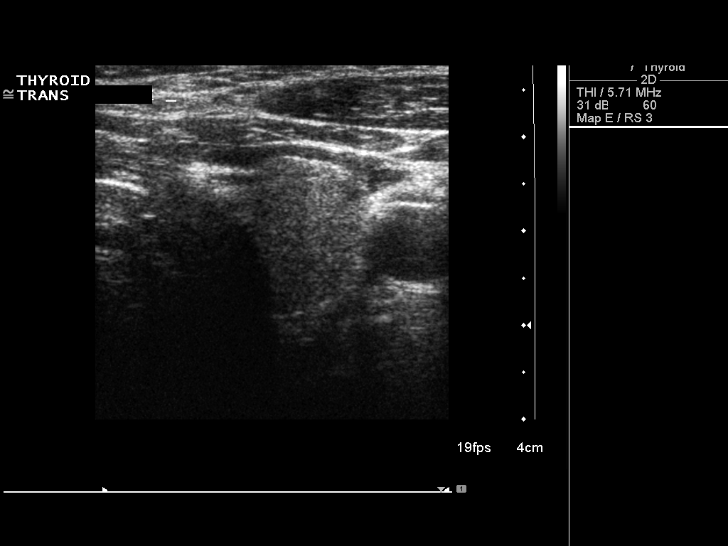
[im 55/74]
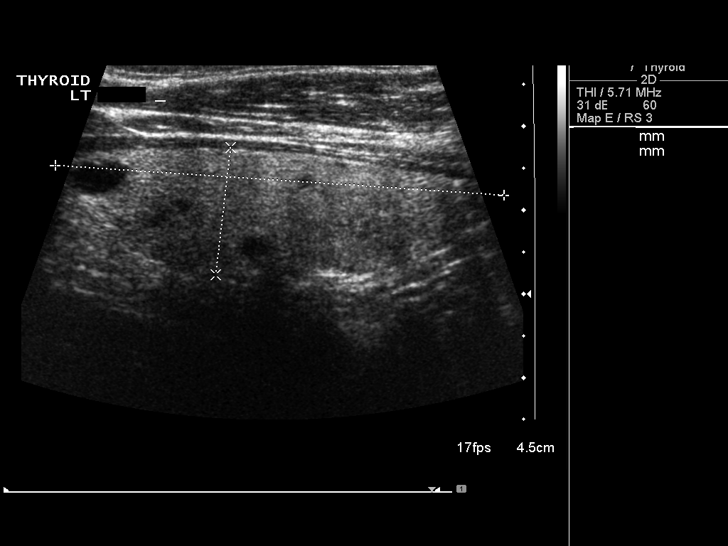
[im 61/74]
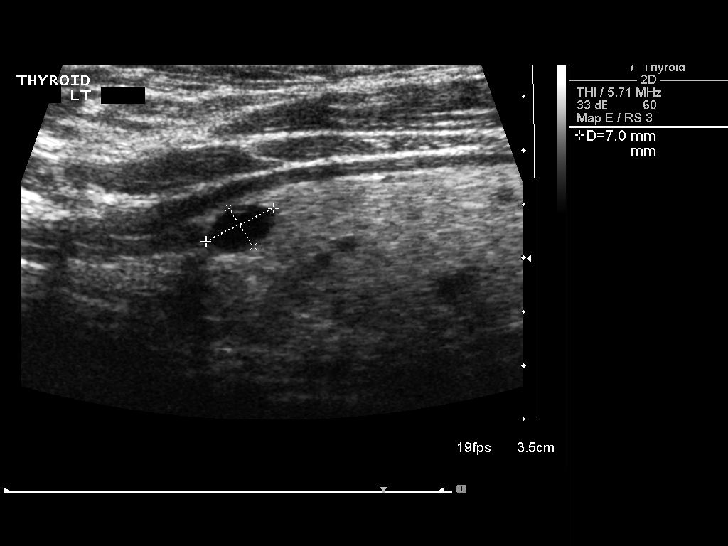
[im 67/74]
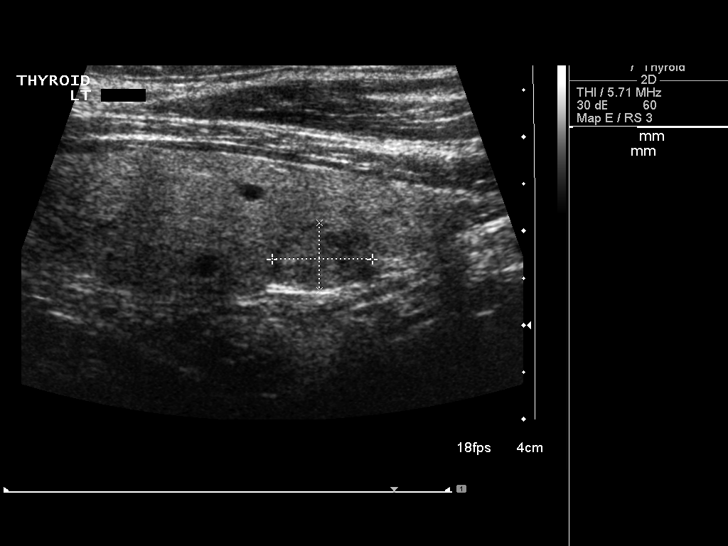
[im 74/74]
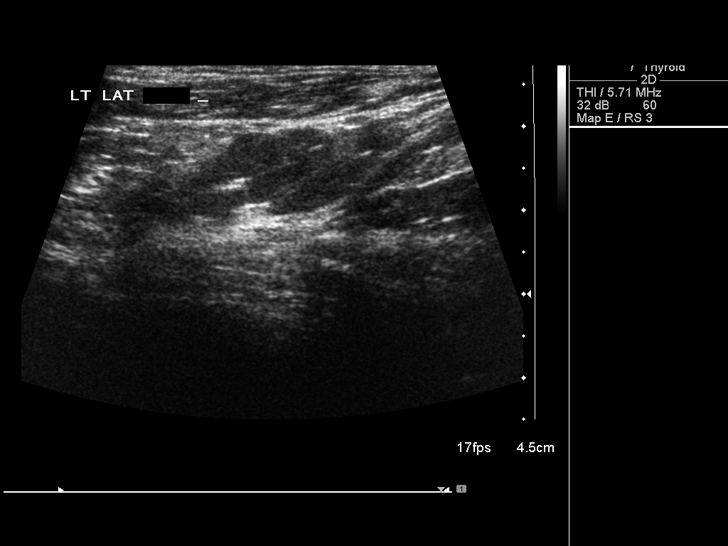

[14 of 25 positions shown; findings below may reference images not displayed]

FINDINGS: Right thyroid lobe

Measurements: 6.2 x 2.4 x 3.0 cm. Dominant right lower pole nodule
measures 2.9 x 1.9 x 2.3 cm and previously measured 2.8 x 1.9 x
cm. Several smaller nodules are scattered throughout the right lobe.

Left thyroid lobe

Measurements: 5.4 x 1.5 x 1.9 cm. Solid lower pole nodule measures
1.1 x 0.7 x 0.8 cm and previously measured 0.7 x 0.5 x 0.6 cm. 7 mm
upper pole cyst.

Isthmus

Thickness: 5 mm.  No nodules visualized.

Lymphadenopathy

None visualized.
IMPRESSION: Multiple bilateral nodules are again noted. The dominant nodule in
the right lobe is stable. A small nodule in the left lobe has
increased in size from 7 mm to 11 mm.

## 2017-02-07 DIAGNOSIS — H401132 Primary open-angle glaucoma, bilateral, moderate stage: Secondary | ICD-10-CM | POA: Diagnosis not present

## 2017-02-07 DIAGNOSIS — H5203 Hypermetropia, bilateral: Secondary | ICD-10-CM | POA: Diagnosis not present

## 2017-02-07 DIAGNOSIS — H524 Presbyopia: Secondary | ICD-10-CM | POA: Diagnosis not present

## 2017-02-07 DIAGNOSIS — H52223 Regular astigmatism, bilateral: Secondary | ICD-10-CM | POA: Diagnosis not present

## 2017-02-20 ENCOUNTER — Encounter: Payer: Self-pay | Admitting: Radiation Oncology

## 2017-02-20 NOTE — Progress Notes (Signed)
The [employer] paperwork has been mailed to the patient on 02/17/2017.

## 2017-02-24 DIAGNOSIS — Z23 Encounter for immunization: Secondary | ICD-10-CM | POA: Diagnosis not present

## 2017-02-28 DIAGNOSIS — H401131 Primary open-angle glaucoma, bilateral, mild stage: Secondary | ICD-10-CM | POA: Diagnosis not present

## 2017-03-21 DIAGNOSIS — H401132 Primary open-angle glaucoma, bilateral, moderate stage: Secondary | ICD-10-CM | POA: Diagnosis not present

## 2017-06-22 DIAGNOSIS — H401322 Pigmentary glaucoma, left eye, moderate stage: Secondary | ICD-10-CM | POA: Diagnosis not present

## 2017-06-22 DIAGNOSIS — H401312 Pigmentary glaucoma, right eye, moderate stage: Secondary | ICD-10-CM | POA: Diagnosis not present

## 2017-06-22 DIAGNOSIS — Q150 Congenital glaucoma: Secondary | ICD-10-CM | POA: Diagnosis not present

## 2017-08-31 DIAGNOSIS — Z125 Encounter for screening for malignant neoplasm of prostate: Secondary | ICD-10-CM | POA: Diagnosis not present

## 2017-08-31 DIAGNOSIS — E782 Mixed hyperlipidemia: Secondary | ICD-10-CM | POA: Diagnosis not present

## 2017-08-31 DIAGNOSIS — R7301 Impaired fasting glucose: Secondary | ICD-10-CM | POA: Diagnosis not present

## 2017-08-31 DIAGNOSIS — E538 Deficiency of other specified B group vitamins: Secondary | ICD-10-CM | POA: Diagnosis not present

## 2017-08-31 DIAGNOSIS — H401194 Primary open-angle glaucoma, unspecified eye, indeterminate stage: Secondary | ICD-10-CM | POA: Diagnosis not present

## 2017-08-31 DIAGNOSIS — I10 Essential (primary) hypertension: Secondary | ICD-10-CM | POA: Diagnosis not present

## 2017-08-31 DIAGNOSIS — M47896 Other spondylosis, lumbar region: Secondary | ICD-10-CM | POA: Diagnosis not present

## 2017-08-31 DIAGNOSIS — E042 Nontoxic multinodular goiter: Secondary | ICD-10-CM | POA: Diagnosis not present

## 2017-08-31 DIAGNOSIS — R972 Elevated prostate specific antigen [PSA]: Secondary | ICD-10-CM | POA: Diagnosis not present

## 2017-09-01 ENCOUNTER — Other Ambulatory Visit: Payer: Self-pay | Admitting: Family Medicine

## 2017-09-01 DIAGNOSIS — E042 Nontoxic multinodular goiter: Secondary | ICD-10-CM

## 2017-09-07 ENCOUNTER — Ambulatory Visit
Admission: RE | Admit: 2017-09-07 | Discharge: 2017-09-07 | Disposition: A | Discharge status: Home / Self Care | Payer: Medicare Other | Source: Ambulatory Visit | Attending: Family Medicine | Admitting: Family Medicine

## 2017-09-07 DIAGNOSIS — E042 Nontoxic multinodular goiter: Secondary | ICD-10-CM | POA: Diagnosis not present

## 2017-09-07 DIAGNOSIS — E010 Iodine-deficiency related diffuse (endemic) goiter: Secondary | ICD-10-CM | POA: Diagnosis not present

## 2017-10-04 DIAGNOSIS — R972 Elevated prostate specific antigen [PSA]: Secondary | ICD-10-CM | POA: Diagnosis not present

## 2017-11-29 DIAGNOSIS — R7301 Impaired fasting glucose: Secondary | ICD-10-CM | POA: Diagnosis not present

## 2017-11-29 DIAGNOSIS — E042 Nontoxic multinodular goiter: Secondary | ICD-10-CM | POA: Diagnosis not present

## 2017-11-29 DIAGNOSIS — E538 Deficiency of other specified B group vitamins: Secondary | ICD-10-CM | POA: Diagnosis not present

## 2017-11-29 DIAGNOSIS — I10 Essential (primary) hypertension: Secondary | ICD-10-CM | POA: Diagnosis not present

## 2017-11-29 DIAGNOSIS — E782 Mixed hyperlipidemia: Secondary | ICD-10-CM | POA: Diagnosis not present

## 2017-11-29 DIAGNOSIS — Z125 Encounter for screening for malignant neoplasm of prostate: Secondary | ICD-10-CM | POA: Diagnosis not present

## 2017-11-29 DIAGNOSIS — R972 Elevated prostate specific antigen [PSA]: Secondary | ICD-10-CM | POA: Diagnosis not present

## 2017-11-29 DIAGNOSIS — M47896 Other spondylosis, lumbar region: Secondary | ICD-10-CM | POA: Diagnosis not present

## 2017-11-29 DIAGNOSIS — H401194 Primary open-angle glaucoma, unspecified eye, indeterminate stage: Secondary | ICD-10-CM | POA: Diagnosis not present

## 2017-11-30 DIAGNOSIS — R350 Frequency of micturition: Secondary | ICD-10-CM | POA: Diagnosis not present

## 2017-11-30 DIAGNOSIS — N403 Nodular prostate with lower urinary tract symptoms: Secondary | ICD-10-CM | POA: Diagnosis not present

## 2017-11-30 DIAGNOSIS — R972 Elevated prostate specific antigen [PSA]: Secondary | ICD-10-CM | POA: Diagnosis not present

## 2018-01-11 DIAGNOSIS — R972 Elevated prostate specific antigen [PSA]: Secondary | ICD-10-CM | POA: Diagnosis not present

## 2018-01-11 DIAGNOSIS — C61 Malignant neoplasm of prostate: Secondary | ICD-10-CM | POA: Diagnosis not present

## 2018-01-18 DIAGNOSIS — H401112 Primary open-angle glaucoma, right eye, moderate stage: Secondary | ICD-10-CM | POA: Diagnosis not present

## 2018-01-18 DIAGNOSIS — H5203 Hypermetropia, bilateral: Secondary | ICD-10-CM | POA: Diagnosis not present

## 2018-01-18 DIAGNOSIS — H401132 Primary open-angle glaucoma, bilateral, moderate stage: Secondary | ICD-10-CM | POA: Diagnosis not present

## 2018-01-18 DIAGNOSIS — H43813 Vitreous degeneration, bilateral: Secondary | ICD-10-CM | POA: Diagnosis not present

## 2018-01-18 DIAGNOSIS — H401122 Primary open-angle glaucoma, left eye, moderate stage: Secondary | ICD-10-CM | POA: Diagnosis not present

## 2018-01-18 DIAGNOSIS — Z961 Presence of intraocular lens: Secondary | ICD-10-CM | POA: Diagnosis not present

## 2018-01-18 DIAGNOSIS — H52223 Regular astigmatism, bilateral: Secondary | ICD-10-CM | POA: Diagnosis not present

## 2018-01-18 DIAGNOSIS — H5340 Unspecified visual field defects: Secondary | ICD-10-CM | POA: Diagnosis not present

## 2018-01-18 DIAGNOSIS — H11153 Pinguecula, bilateral: Secondary | ICD-10-CM | POA: Diagnosis not present

## 2018-01-19 DIAGNOSIS — C61 Malignant neoplasm of prostate: Secondary | ICD-10-CM | POA: Diagnosis not present

## 2018-02-12 ENCOUNTER — Institutional Professional Consult (permissible substitution): Payer: Medicare Other | Admitting: Radiation Oncology

## 2018-02-14 ENCOUNTER — Other Ambulatory Visit: Payer: Self-pay

## 2018-02-14 ENCOUNTER — Ambulatory Visit
Admission: RE | Admit: 2018-02-14 | Discharge: 2018-02-14 | Disposition: A | Discharge status: Home / Self Care | Payer: Medicare Other | Source: Ambulatory Visit | Attending: Radiation Oncology | Admitting: Radiation Oncology

## 2018-02-14 ENCOUNTER — Encounter: Payer: Self-pay | Admitting: Radiation Oncology

## 2018-02-14 VITALS — BP 169/77 | HR 76 | Temp 98.6°F | Resp 20 | Ht 71.0 in | Wt 211.6 lb

## 2018-02-14 DIAGNOSIS — I10 Essential (primary) hypertension: Secondary | ICD-10-CM | POA: Insufficient documentation

## 2018-02-14 DIAGNOSIS — M1990 Unspecified osteoarthritis, unspecified site: Secondary | ICD-10-CM | POA: Diagnosis not present

## 2018-02-14 DIAGNOSIS — Z7982 Long term (current) use of aspirin: Secondary | ICD-10-CM | POA: Insufficient documentation

## 2018-02-14 DIAGNOSIS — Z87891 Personal history of nicotine dependence: Secondary | ICD-10-CM | POA: Insufficient documentation

## 2018-02-14 DIAGNOSIS — Z79899 Other long term (current) drug therapy: Secondary | ICD-10-CM | POA: Diagnosis not present

## 2018-02-14 DIAGNOSIS — C61 Malignant neoplasm of prostate: Secondary | ICD-10-CM

## 2018-02-14 DIAGNOSIS — R972 Elevated prostate specific antigen [PSA]: Secondary | ICD-10-CM | POA: Diagnosis not present

## 2018-02-14 HISTORY — DX: Malignant neoplasm of prostate: C61

## 2018-02-14 NOTE — Progress Notes (Signed)
Radiation Oncology         [phone] ________________________________  Initial outpatient Consultation  Name: Jose Schmitt MRN: [id number]  Date: 02/14/2018  DOB: 12-30-1940  DJ:MEQAST, Jose Bihari, MD  Lucas Mallow, MD   REFERRING PHYSICIAN: Lucas Mallow, MD  DIAGNOSIS: 77 y.o. gentleman with Stage T2a adenocarcinoma of the prostate with Gleason Score of 4+3, and PSA of 5.2.    ICD-10-CM   1. Malignant neoplasm of prostate ([place]) C61     HISTORY OF PRESENT ILLNESS: Jose Schmitt is a 77 y.o. male with a diagnosis of prostate cancer. He was noted to have an elevated PSA of 5.2 by his primary care physician, Dr. Hulan Fess.  Accordingly, he was referred for evaluation in urology by Dr. Gloriann Loan on 11/30/17,  digital rectal examination was performed at that time revealing a 1 cm left apical nodule.  The patient proceeded to transrectal ultrasound with 12 biopsies of the prostate on 01/11/18.  The prostate volume measured 35.4 cc.  Out of 12 core biopsies, 3 were positive.  The maximum Gleason score was 4+3, and this was seen in the left apex correlating with the site of palpable disease.  Additionally, there was Gleason 3+3 disease in the right mid and Gleason 3+4 disease in the left apex lateral.  The patient reviewed the biopsy results with his urologist and he has kindly been referred today for discussion of potential radiation treatment options.    PREVIOUS RADIATION THERAPY: No  PAST MEDICAL HISTORY:  Past Medical History:  Diagnosis Date  . Arthritis   . Chronic back pain    DDD and stenosis  . GERD (gastroesophageal reflux disease)    takes Zantac daily  . History of IBS   . Hx of seasonal allergies   . Hyperlipidemia    takes Zocor nightly  . Hypertension    takes Lisinopril,Atenolol,Hctz daily  . Impaired hearing    right ear  . Kidney stones    hx of   . Prostate cancer ([place])   . Thyroid nodule       PAST SURGICAL HISTORY: Past Surgical History:    Procedure Laterality Date  . BIOPSY THYROID    . cataract surgery  4-22yrs ago   bilateral   . COLONOSCOPY    . MOUTH SURGERY     impacted wisdom teeth  . PROSTATE BIOPSY    . TONSILLECTOMY  as a child  . VASECTOMY      FAMILY HISTORY:  Family History  Problem Relation Age of Onset  . Cancer Paternal Grandmother        Unknown type  . Pancreatic cancer Paternal Uncle   . Throat cancer Maternal Aunt   . Anesthesia problems Neg Hx   . Hypotension Neg Hx   . Malignant hyperthermia Neg Hx   . Pseudochol deficiency Neg Hx     SOCIAL HISTORY:  Social History   Socioeconomic History  . Marital status: Married    Spouse name: Not on file  . Number of children: 2  . Years of education: Not on file  . Highest education level: Not on file  Occupational History  . Not on file  Social Needs  . Financial resource strain: Not on file  . Food insecurity:    Worry: Not on file    Inability: Not on file  . Transportation needs:    Medical: No    Non-medical: No  Tobacco Use  . Smoking status: Former Smoker  Packs/day: 1.00    Years: 20.00    Pack years: 20.00    Types: Cigarettes    Last attempt to quit: 11/14/1999    Years since quitting: 18.2  . Smokeless tobacco: Never Used  . Tobacco comment: quit 69yrs ago  Substance and Sexual Activity  . Alcohol use: Yes    Comment: socially  . Drug use: No  . Sexual activity: Yes  Lifestyle  . Physical activity:    Days per week: Not on file    Minutes per session: Not on file  . Stress: Not on file  Relationships  . Social connections:    Talks on phone: Not on file    Gets together: Not on file    Attends religious service: Not on file    Active member of club or organization: Not on file    Attends meetings of clubs or organizations: Not on file    Relationship status: Not on file  . Intimate partner violence:    Fear of current or ex partner: Not on file    Emotionally abused: Not on file    Physically abused: Not  on file    Forced sexual activity: Not on file  Other Topics Concern  . Not on file  Social History Narrative  . Not on file    ALLERGIES: Contrast media [iodinated diagnostic agents]  MEDICATIONS:  Current Outpatient Medications  Medication Sig Dispense Refill  . aspirin 325 MG tablet Take 325 mg by mouth daily.    . fish oil-omega-3 fatty acids 1000 MG capsule Take 1 g by mouth 2 (two) times daily.    . hydrochlorothiazide (HYDRODIURIL) 25 MG tablet Take 25 mg by mouth daily.    Marland Kitchen lisinopril (PRINIVIL,ZESTRIL) 20 MG tablet Take 20 mg by mouth daily.    . naproxen sodium (ANAPROX) 220 MG tablet Take 220-440 mg by mouth 2 (two) times daily as needed. For knee pain    . ranitidine (ZANTAC) 150 MG tablet Take 150 mg by mouth daily.    . simvastatin (ZOCOR) 40 MG tablet TK 1 T PO QPM.  3  . simvastatin (ZOCOR) 80 MG tablet Take 80 mg by mouth at bedtime.    . timolol (BETIMOL) 0.25 % ophthalmic solution 1-2 drops 2 (two) times daily.    . TRAVATAN Z 0.004 % SOLN ophthalmic solution APP 1 GTT INTO OU HS  3   No current facility-administered medications for this encounter.     REVIEW OF SYSTEMS:  On review of systems, the patient reports that he is doing well overall. He denies any chest pain, shortness of breath, cough, fevers, chills, night sweats, unintended weight changes. He denies any bowel disturbances, and denies abdominal pain, nausea or vomiting. He denies any new musculoskeletal or joint aches or pains. His IPSS was 18, indicating moderate urinary symptoms. He is unable to complete sexual activity with most attempts. A complete review of systems is obtained and is otherwise negative.    PHYSICAL EXAM:  Wt Readings from Last 3 Encounters:  02/14/18 211 lb 9.6 oz (96 kg)  07/26/11 228 lb 9.9 oz (103.7 kg)   Temp Readings from Last 3 Encounters:  02/14/18 98.6 F (37 C) (Oral)  08/03/11 97.8 F (36.6 C) (Oral)  07/26/11 97.6 F (36.4 C)   BP Readings from Last 3  Encounters:  02/14/18 (!) 169/77  08/03/11 (!) 149/78  07/26/11 (!) 150/85   Pulse Readings from Last 3 Encounters:  02/14/18 76  08/03/11 70  07/26/11 63   Pain Assessment Pain Score: 0-No pain/10  In general this is a well appearing Caucasian gentleman in no acute distress. He is alert and oriented x4 and appropriate throughout the examination. HEENT reveals that the patient is normocephalic, atraumatic. EOMs are intact. PERRLA. Skin is intact without any evidence of gross lesions. Cardiovascular exam reveals a regular rate and rhythm, no clicks rubs or murmurs are auscultated. Chest is clear to auscultation bilaterally. Lymphatic assessment is performed and does not reveal any adenopathy in the cervical, supraclavicular, axillary, or inguinal chains. Abdomen has active bowel sounds in all quadrants and is intact. The abdomen is soft, non tender, non distended. Lower extremities are negative for pretibial pitting edema, deep calf tenderness, cyanosis or clubbing.   KPS = 100  100 - Normal; no complaints; no evidence of disease. 90   - Able to carry on normal activity; minor signs or symptoms of disease. 80   - Normal activity with effort; some signs or symptoms of disease. 72   - Cares for self; unable to carry on normal activity or to do active work. 60   - Requires occasional assistance, but is able to care for most of his personal needs. 50   - Requires considerable assistance and frequent medical care. 48   - Disabled; requires special care and assistance. 77   - Severely disabled; hospital admission is indicated although death not imminent. 50   - Very sick; hospital admission necessary; active supportive treatment necessary. 10   - Moribund; fatal processes progressing rapidly. 0     - Dead  Karnofsky DA, Abelmann [place], Craver LS and Burchenal [facility] [phone]) The use of the nitrogen mustards in the palliative treatment of carcinoma: with particular reference to bronchogenic carcinoma  Cancer 1 [phone]  LABORATORY DATA:  Lab Results  Component Value Date   WBC 9.0 07/26/2011   HGB 16.5 07/26/2011   HCT 46.5 07/26/2011   MCV 91.5 07/26/2011   PLT 254 07/26/2011   Lab Results  Component Value Date   NA 139 07/26/2011   K 4.5 07/26/2011   CL 100 07/26/2011   CO2 29 07/26/2011   No results found for: ALT, AST, GGT, ALKPHOS, BILITOT   RADIOGRAPHY: No results found.    IMPRESSION/PLAN: 1. 77 y.o. gentleman with Stage T2a adenocarcinoma of the prostate with Gleason Score of 4+3, and PSA of 5.2. We discussed the patient's workup and outlined the nature of prostate cancer in this setting. The patient's T stage, Gleason's score, and PSA put him into the unfavorable intermediate risk group. Accordingly, he is eligible for a variety of potential treatment options including brachytherapy, 5.5 - 8 weeks of external radiation or 5 weeks of external radiation followed by a brachytherapy boost. We discussed the available radiation techniques, and focused on the details of logistics and delivery. He is not felt to be an ideal brachytherapy candidate due to his multiple medical co-morbidities as well as significant IPSS score with obstructive type symptoms which put him at a higher risk for potential AUR post-operatively. These concerns were discussed in detail today. We discussed and outlined the risks, benefits, short and long-term effects associated with radiotherapy and compared and contrasted these with prostatectomy. We discussed the role of SpaceOAR in reducing the rectal toxicity associated with radiotherapy. We also detailed the potential role for ST-ADT in combination with radiotherapy in the treatment of unfavorable risk prostate cancer and outlined the associated side effects that could be expected with this therapy. We  discussed that while this is certainly an option, there is no data to suggest a substantial benefit to overall survival in the setting of Gleason 4+3 disease and  therefore is felt reasonable to defer at this time in an effort to avoid the unpleasant side effects that would likely have a significant negative impact on quality of life.  The patient was encouraged to ask questions that were answered to his stated satisfaction.  He appears to have a good understanding of his overall disease and our recommendations for treatment which are of curative intent.  At the conclusion of our conversation, the patient elects to proceed with a 5.5 week course of daily radiotherapy in the management of his disease.  He prefers to avoid ADT for now and reserve for use in the future should he have a recurrence of disease in years to come.  We are in support of this decision and will share this information with Dr. Gloriann Loan. He is scheduled for a follow up visit with Dr. Gloriann Loan on 02/15/18 and will also discuss this decision further at that time. He will need fiducial markers and SpaceOAR gel placed prior to CT Simulation in preparation for beginning treatments.  We will move forward with coordinating this procedure in the near future with Dr. Purvis Sheffield surgery schedulers and then schedule CT SIM to follow. The patient will be attending his grandson's wedding in [place] around the 18th of October and prefers to begin radiotherapy after 03/19/18.     Nicholos Johns, PA-C    [name], MD  [facility] Direct Dial: [phone]  Fax: [phone] [email or web address]  Skype  [email or web address]  This document serves as a record of services personally performed by [employer], PA-C and Dr. Tammi Klippel. It was created on their behalf by Wilburn Mylar, a trained medical scribe. The creation of this record is based on the scribe's personal observations and the provider's statements to them. This document has been checked and approved by the attending provider.

## 2018-02-14 NOTE — Progress Notes (Signed)
GU Location of Tumor / Histology: prostatic adenocarcinoma  If Prostate Cancer, Gleason Score is (4 + 3) and PSA is (5.2) on 10/04/2017.  08/31/2017  PSA  5.3 12/29/2015  PSA  2.78  Dalia Heading was referred by Dr. Hulan Fess to Dr. Gloriann Loan after an elevated PSA was discovered that did not respond to two weeks of Cipro.   Has always had PSA 2.5-2.7.  Increased to 5 in 3 month period.  PCP referred to Dr. Gloriann Loan.    Biopsies of prostate (if applicable) revealed:    Past/Anticipated interventions by urology, if any: prostate biopsy, referral to radiation oncology  Past/Anticipated interventions by medical oncology, if any: no  Weight changes, if any: no  Bowel/Bladder complaints, if any: No changes with bowels.  Has some changes in urination habits.  No urgency, no nocturia, no leakage, no incontinence.  Nausea/Vomiting, if any: no  Pain issues, if any:  Back pain  SAFETY ISSUES:  Prior radiation? No  Pacemaker/ICD? No  Possible current pregnancy? no  Is the patient on methotrexate? No  Current Complaints / other details:  78 year old male. Married with two daughters.

## 2018-02-15 DIAGNOSIS — N403 Nodular prostate with lower urinary tract symptoms: Secondary | ICD-10-CM | POA: Diagnosis not present

## 2018-02-15 DIAGNOSIS — R350 Frequency of micturition: Secondary | ICD-10-CM | POA: Diagnosis not present

## 2018-02-15 DIAGNOSIS — C61 Malignant neoplasm of prostate: Secondary | ICD-10-CM | POA: Diagnosis not present

## 2018-02-19 DIAGNOSIS — Z23 Encounter for immunization: Secondary | ICD-10-CM | POA: Diagnosis not present

## 2018-03-06 ENCOUNTER — Other Ambulatory Visit: Payer: Self-pay | Admitting: Urology

## 2018-03-06 ENCOUNTER — Telehealth: Payer: Self-pay | Admitting: *Deleted

## 2018-03-06 DIAGNOSIS — C61 Malignant neoplasm of prostate: Secondary | ICD-10-CM

## 2018-03-06 NOTE — Telephone Encounter (Signed)
CALLED PATIENT TO INFORM OF FID. MARKERS AND SPACE OAR TO BE DONE @ DR. BELL'S OFFICE ON 03-20-18 @ 11 AM AND HIS SIM ON 03-22-18 @ 3 PM @ DR. MANNING'S OFFICE, LVM FOR A RETURN CALL

## 2018-03-16 ENCOUNTER — Encounter: Payer: Self-pay | Admitting: Radiation Oncology

## 2018-03-20 ENCOUNTER — Telehealth: Payer: Self-pay | Admitting: *Deleted

## 2018-03-20 ENCOUNTER — Other Ambulatory Visit: Payer: Self-pay | Admitting: Urology

## 2018-03-20 MED ORDER — DIAZEPAM 5 MG PO TABS: 5.0000 mg | ORAL_TABLET | ORAL | 0 refills | Status: AC | PRN

## 2018-03-20 NOTE — Telephone Encounter (Signed)
CALLED PATIENT TO INFORM OF SIM AND MRI, LVM FOR A RETURN CALL

## 2018-03-22 ENCOUNTER — Ambulatory Visit (HOSPITAL_COMMUNITY): Admission: RE | Admit: 2018-03-22 | Payer: Medicare Other | Source: Ambulatory Visit

## 2018-03-22 ENCOUNTER — Ambulatory Visit: Payer: Medicare Other | Admitting: Radiation Oncology

## 2018-03-27 DIAGNOSIS — C61 Malignant neoplasm of prostate: Secondary | ICD-10-CM | POA: Diagnosis not present

## 2018-03-30 ENCOUNTER — Ambulatory Visit (HOSPITAL_COMMUNITY)
Admission: RE | Admit: 2018-03-30 | Discharge: 2018-03-30 | Disposition: A | Discharge status: Home / Self Care | Payer: Medicare Other | Source: Ambulatory Visit | Attending: Urology | Admitting: Urology

## 2018-03-30 ENCOUNTER — Ambulatory Visit
Admission: RE | Admit: 2018-03-30 | Discharge: 2018-03-30 | Disposition: A | Discharge status: Home / Self Care | Payer: Medicare Other | Source: Ambulatory Visit | Attending: Radiation Oncology | Admitting: Radiation Oncology

## 2018-03-30 ENCOUNTER — Encounter: Payer: Self-pay | Admitting: Medical Oncology

## 2018-03-30 DIAGNOSIS — C61 Malignant neoplasm of prostate: Secondary | ICD-10-CM | POA: Diagnosis not present

## 2018-03-30 DIAGNOSIS — Z510 Encounter for antineoplastic radiation therapy: Secondary | ICD-10-CM | POA: Diagnosis not present

## 2018-03-30 NOTE — Progress Notes (Signed)
  Radiation Oncology         [phone] ________________________________  Name: Jose Schmitt MRN: [id number]  Date: 03/30/2018  DOB: 1940-12-21  SIMULATION AND TREATMENT PLANNING NOTE    ICD-10-CM   1. Malignant neoplasm of prostate ([place]) C61     DIAGNOSIS:  77 y.o. gentleman with Stage T2a adenocarcinoma of the prostate with Gleason Score of 4+3, and PSA of 5.2.  NARRATIVE:  The patient was brought to the [facility].  Identity was confirmed.  All relevant records and images related to the planned course of therapy were reviewed.  The patient freely provided informed written consent to proceed with treatment after reviewing the details related to the planned course of therapy. The consent form was witnessed and verified by the simulation staff.  Then, the patient was set-up in a stable reproducible supine position for radiation therapy.  A vacuum lock pillow device was custom fabricated to position his legs in a reproducible immobilized position.  Then, I performed a urethrogram under sterile conditions to identify the prostatic apex.  CT images were obtained.  Surface markings were placed.  The CT images were loaded into the planning software.  Then the prostate target and avoidance structures including the rectum, bladder, bowel and hips were contoured.  Treatment planning then occurred.  The radiation prescription was entered and confirmed.  A total of one complex treatment devices was fabricated. I have requested : Intensity Modulated Radiotherapy (IMRT) is medically necessary for this case for the following reason:  Rectal sparing.Marland Kitchen  PLAN:  The patient will receive 70 Gy in 28 fractions.  ________________________________  Sheral Apley Tammi Klippel, M.D.  This document serves as a record of services personally performed by [name], MD. It was created on his behalf by Wilburn Mylar, a trained medical scribe. The creation of this record is based on the scribe's  personal observations and the provider's statements to them. This document has been checked and approved by the attending provider.

## 2018-04-05 DIAGNOSIS — C61 Malignant neoplasm of prostate: Secondary | ICD-10-CM | POA: Diagnosis not present

## 2018-04-05 DIAGNOSIS — Z510 Encounter for antineoplastic radiation therapy: Secondary | ICD-10-CM | POA: Diagnosis not present

## 2018-04-09 ENCOUNTER — Ambulatory Visit
Admission: RE | Admit: 2018-04-09 | Discharge: 2018-04-09 | Disposition: A | Discharge status: Home / Self Care | Payer: Medicare Other | Source: Ambulatory Visit | Attending: Radiation Oncology | Admitting: Radiation Oncology

## 2018-04-09 DIAGNOSIS — C61 Malignant neoplasm of prostate: Secondary | ICD-10-CM | POA: Diagnosis not present

## 2018-04-09 DIAGNOSIS — Z510 Encounter for antineoplastic radiation therapy: Secondary | ICD-10-CM | POA: Diagnosis not present

## 2018-04-10 ENCOUNTER — Ambulatory Visit
Admission: RE | Admit: 2018-04-10 | Discharge: 2018-04-10 | Disposition: A | Discharge status: Home / Self Care | Payer: Medicare Other | Source: Ambulatory Visit | Attending: Radiation Oncology | Admitting: Radiation Oncology

## 2018-04-10 DIAGNOSIS — Z510 Encounter for antineoplastic radiation therapy: Secondary | ICD-10-CM | POA: Diagnosis not present

## 2018-04-10 DIAGNOSIS — C61 Malignant neoplasm of prostate: Secondary | ICD-10-CM | POA: Diagnosis not present

## 2018-04-11 ENCOUNTER — Ambulatory Visit
Admission: RE | Admit: 2018-04-11 | Discharge: 2018-04-11 | Disposition: A | Discharge status: Home / Self Care | Payer: Medicare Other | Source: Ambulatory Visit | Attending: Radiation Oncology | Admitting: Radiation Oncology

## 2018-04-11 DIAGNOSIS — C61 Malignant neoplasm of prostate: Secondary | ICD-10-CM | POA: Diagnosis not present

## 2018-04-11 DIAGNOSIS — Z510 Encounter for antineoplastic radiation therapy: Secondary | ICD-10-CM | POA: Diagnosis not present

## 2018-04-16 ENCOUNTER — Ambulatory Visit
Admission: RE | Admit: 2018-04-16 | Discharge: 2018-04-16 | Disposition: A | Discharge status: Home / Self Care | Payer: Medicare Other | Source: Ambulatory Visit | Attending: Radiation Oncology | Admitting: Radiation Oncology

## 2018-04-16 DIAGNOSIS — C61 Malignant neoplasm of prostate: Secondary | ICD-10-CM | POA: Insufficient documentation

## 2018-04-16 DIAGNOSIS — Z510 Encounter for antineoplastic radiation therapy: Secondary | ICD-10-CM | POA: Insufficient documentation

## 2018-04-17 ENCOUNTER — Ambulatory Visit
Admission: RE | Admit: 2018-04-17 | Discharge: 2018-04-17 | Disposition: A | Discharge status: Home / Self Care | Payer: Medicare Other | Source: Ambulatory Visit | Attending: Radiation Oncology | Admitting: Radiation Oncology

## 2018-04-17 DIAGNOSIS — Z510 Encounter for antineoplastic radiation therapy: Secondary | ICD-10-CM | POA: Diagnosis not present

## 2018-04-17 DIAGNOSIS — C61 Malignant neoplasm of prostate: Secondary | ICD-10-CM | POA: Diagnosis not present

## 2018-04-18 ENCOUNTER — Ambulatory Visit
Admission: RE | Admit: 2018-04-18 | Discharge: 2018-04-18 | Disposition: A | Discharge status: Home / Self Care | Payer: Medicare Other | Source: Ambulatory Visit | Attending: Radiation Oncology | Admitting: Radiation Oncology

## 2018-04-18 DIAGNOSIS — Z510 Encounter for antineoplastic radiation therapy: Secondary | ICD-10-CM | POA: Diagnosis not present

## 2018-04-18 DIAGNOSIS — C61 Malignant neoplasm of prostate: Secondary | ICD-10-CM | POA: Diagnosis not present

## 2018-04-19 ENCOUNTER — Ambulatory Visit
Admission: RE | Admit: 2018-04-19 | Discharge: 2018-04-19 | Disposition: A | Discharge status: Home / Self Care | Payer: Medicare Other | Source: Ambulatory Visit | Attending: Radiation Oncology | Admitting: Radiation Oncology

## 2018-04-19 DIAGNOSIS — C61 Malignant neoplasm of prostate: Secondary | ICD-10-CM | POA: Diagnosis not present

## 2018-04-19 DIAGNOSIS — Z510 Encounter for antineoplastic radiation therapy: Secondary | ICD-10-CM | POA: Diagnosis not present

## 2018-04-20 ENCOUNTER — Ambulatory Visit
Admission: RE | Admit: 2018-04-20 | Discharge: 2018-04-20 | Disposition: A | Discharge status: Home / Self Care | Payer: Medicare Other | Source: Ambulatory Visit | Attending: Radiation Oncology | Admitting: Radiation Oncology

## 2018-04-20 DIAGNOSIS — Z510 Encounter for antineoplastic radiation therapy: Secondary | ICD-10-CM | POA: Diagnosis not present

## 2018-04-20 DIAGNOSIS — C61 Malignant neoplasm of prostate: Secondary | ICD-10-CM | POA: Diagnosis not present

## 2018-04-23 ENCOUNTER — Ambulatory Visit
Admission: RE | Admit: 2018-04-23 | Discharge: 2018-04-23 | Disposition: A | Discharge status: Home / Self Care | Payer: Medicare Other | Source: Ambulatory Visit | Attending: Radiation Oncology | Admitting: Radiation Oncology

## 2018-04-23 DIAGNOSIS — Z510 Encounter for antineoplastic radiation therapy: Secondary | ICD-10-CM | POA: Diagnosis not present

## 2018-04-23 DIAGNOSIS — C61 Malignant neoplasm of prostate: Secondary | ICD-10-CM | POA: Diagnosis not present

## 2018-04-24 ENCOUNTER — Ambulatory Visit
Admission: RE | Admit: 2018-04-24 | Discharge: 2018-04-24 | Disposition: A | Discharge status: Home / Self Care | Payer: Medicare Other | Source: Ambulatory Visit | Attending: Radiation Oncology | Admitting: Radiation Oncology

## 2018-04-24 DIAGNOSIS — C61 Malignant neoplasm of prostate: Secondary | ICD-10-CM | POA: Diagnosis not present

## 2018-04-24 DIAGNOSIS — Z510 Encounter for antineoplastic radiation therapy: Secondary | ICD-10-CM | POA: Diagnosis not present

## 2018-04-25 ENCOUNTER — Ambulatory Visit
Admission: RE | Admit: 2018-04-25 | Discharge: 2018-04-25 | Disposition: A | Discharge status: Home / Self Care | Payer: Medicare Other | Source: Ambulatory Visit | Attending: Radiation Oncology | Admitting: Radiation Oncology

## 2018-04-25 DIAGNOSIS — C61 Malignant neoplasm of prostate: Secondary | ICD-10-CM | POA: Diagnosis not present

## 2018-04-25 DIAGNOSIS — Z510 Encounter for antineoplastic radiation therapy: Secondary | ICD-10-CM | POA: Diagnosis not present

## 2018-04-26 ENCOUNTER — Ambulatory Visit
Admission: RE | Admit: 2018-04-26 | Discharge: 2018-04-26 | Disposition: A | Discharge status: Home / Self Care | Payer: Medicare Other | Source: Ambulatory Visit | Attending: Radiation Oncology | Admitting: Radiation Oncology

## 2018-04-26 DIAGNOSIS — Z510 Encounter for antineoplastic radiation therapy: Secondary | ICD-10-CM | POA: Diagnosis not present

## 2018-04-26 DIAGNOSIS — C61 Malignant neoplasm of prostate: Secondary | ICD-10-CM | POA: Diagnosis not present

## 2018-04-27 ENCOUNTER — Ambulatory Visit
Admission: RE | Admit: 2018-04-27 | Discharge: 2018-04-27 | Disposition: A | Discharge status: Home / Self Care | Payer: Medicare Other | Source: Ambulatory Visit | Attending: Radiation Oncology | Admitting: Radiation Oncology

## 2018-04-27 ENCOUNTER — Other Ambulatory Visit: Payer: Self-pay | Admitting: Radiation Oncology

## 2018-04-27 DIAGNOSIS — C61 Malignant neoplasm of prostate: Secondary | ICD-10-CM | POA: Diagnosis not present

## 2018-04-27 DIAGNOSIS — Z510 Encounter for antineoplastic radiation therapy: Secondary | ICD-10-CM | POA: Diagnosis not present

## 2018-04-27 MED ORDER — TAMSULOSIN HCL 0.4 MG PO CAPS: 0.4000 mg | ORAL_CAPSULE | Freq: Every day | ORAL | 5 refills | Status: DC

## 2018-04-30 ENCOUNTER — Ambulatory Visit
Admission: RE | Admit: 2018-04-30 | Discharge: 2018-04-30 | Disposition: A | Discharge status: Home / Self Care | Payer: Medicare Other | Source: Ambulatory Visit | Attending: Radiation Oncology | Admitting: Radiation Oncology

## 2018-04-30 DIAGNOSIS — C61 Malignant neoplasm of prostate: Secondary | ICD-10-CM | POA: Diagnosis not present

## 2018-04-30 DIAGNOSIS — Z510 Encounter for antineoplastic radiation therapy: Secondary | ICD-10-CM | POA: Diagnosis not present

## 2018-05-01 ENCOUNTER — Ambulatory Visit
Admission: RE | Admit: 2018-05-01 | Discharge: 2018-05-01 | Disposition: A | Discharge status: Home / Self Care | Payer: Medicare Other | Source: Ambulatory Visit | Attending: Radiation Oncology | Admitting: Radiation Oncology

## 2018-05-01 ENCOUNTER — Encounter: Payer: Self-pay | Admitting: Medical Oncology

## 2018-05-01 DIAGNOSIS — C61 Malignant neoplasm of prostate: Secondary | ICD-10-CM | POA: Diagnosis not present

## 2018-05-01 DIAGNOSIS — Z510 Encounter for antineoplastic radiation therapy: Secondary | ICD-10-CM | POA: Diagnosis not present

## 2018-05-01 NOTE — Progress Notes (Signed)
Introduced myself to Mr. Jose Schmitt as the prostate nurse navigator and my role. I was unable to meet him the day he consulted with Dr. Tammi Klippel 10/2. He is here for CT simulation and will begin radiation 04/09/18. I asked him to call me with questions or concerns.

## 2018-05-02 ENCOUNTER — Ambulatory Visit
Admission: RE | Admit: 2018-05-02 | Discharge: 2018-05-02 | Disposition: A | Discharge status: Home / Self Care | Payer: Medicare Other | Source: Ambulatory Visit | Attending: Radiation Oncology | Admitting: Radiation Oncology

## 2018-05-02 DIAGNOSIS — Z510 Encounter for antineoplastic radiation therapy: Secondary | ICD-10-CM | POA: Diagnosis not present

## 2018-05-02 DIAGNOSIS — C61 Malignant neoplasm of prostate: Secondary | ICD-10-CM | POA: Diagnosis not present

## 2018-05-03 ENCOUNTER — Ambulatory Visit
Admission: RE | Admit: 2018-05-03 | Discharge: 2018-05-03 | Disposition: A | Discharge status: Home / Self Care | Payer: Medicare Other | Source: Ambulatory Visit | Attending: Radiation Oncology | Admitting: Radiation Oncology

## 2018-05-03 DIAGNOSIS — C61 Malignant neoplasm of prostate: Secondary | ICD-10-CM | POA: Diagnosis not present

## 2018-05-03 DIAGNOSIS — Z510 Encounter for antineoplastic radiation therapy: Secondary | ICD-10-CM | POA: Diagnosis not present

## 2018-05-04 ENCOUNTER — Ambulatory Visit
Admission: RE | Admit: 2018-05-04 | Discharge: 2018-05-04 | Disposition: A | Discharge status: Home / Self Care | Payer: Medicare Other | Source: Ambulatory Visit | Attending: Radiation Oncology | Admitting: Radiation Oncology

## 2018-05-04 DIAGNOSIS — Z510 Encounter for antineoplastic radiation therapy: Secondary | ICD-10-CM | POA: Diagnosis not present

## 2018-05-04 DIAGNOSIS — C61 Malignant neoplasm of prostate: Secondary | ICD-10-CM | POA: Diagnosis not present

## 2018-05-07 ENCOUNTER — Ambulatory Visit
Admission: RE | Admit: 2018-05-07 | Discharge: 2018-05-07 | Disposition: A | Discharge status: Home / Self Care | Payer: Medicare Other | Source: Ambulatory Visit | Attending: Radiation Oncology | Admitting: Radiation Oncology

## 2018-05-07 DIAGNOSIS — Z510 Encounter for antineoplastic radiation therapy: Secondary | ICD-10-CM | POA: Diagnosis not present

## 2018-05-07 DIAGNOSIS — C61 Malignant neoplasm of prostate: Secondary | ICD-10-CM | POA: Diagnosis not present

## 2018-05-08 ENCOUNTER — Ambulatory Visit
Admission: RE | Admit: 2018-05-08 | Discharge: 2018-05-08 | Disposition: A | Discharge status: Home / Self Care | Payer: Medicare Other | Source: Ambulatory Visit | Attending: Radiation Oncology | Admitting: Radiation Oncology

## 2018-05-08 DIAGNOSIS — C61 Malignant neoplasm of prostate: Secondary | ICD-10-CM | POA: Diagnosis not present

## 2018-05-08 DIAGNOSIS — Z510 Encounter for antineoplastic radiation therapy: Secondary | ICD-10-CM | POA: Diagnosis not present

## 2018-05-09 IMAGING — US US THYROID
1 series · 13 of 25 positions shown · non-contrast
Comparison: 05/28/2014, 04/27/2011

CLINICAL DATA: Multinodular goiter

EXAM:
THYROID ULTRASOUND
TECHNIQUE: Ultrasound examination of the thyroid gland and adjacent soft
tissues was performed.

[Series 1: us thyroid · 0.08mm/px · 13 of 56 slices shown]
[im 1/56]
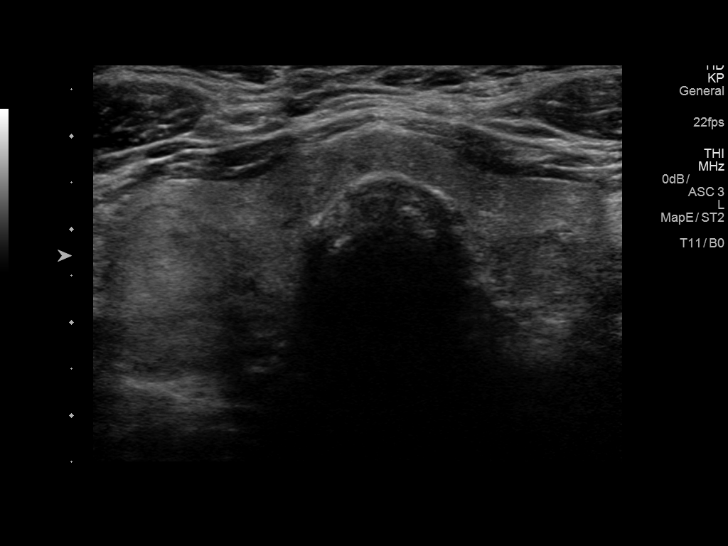
[im 5/56]
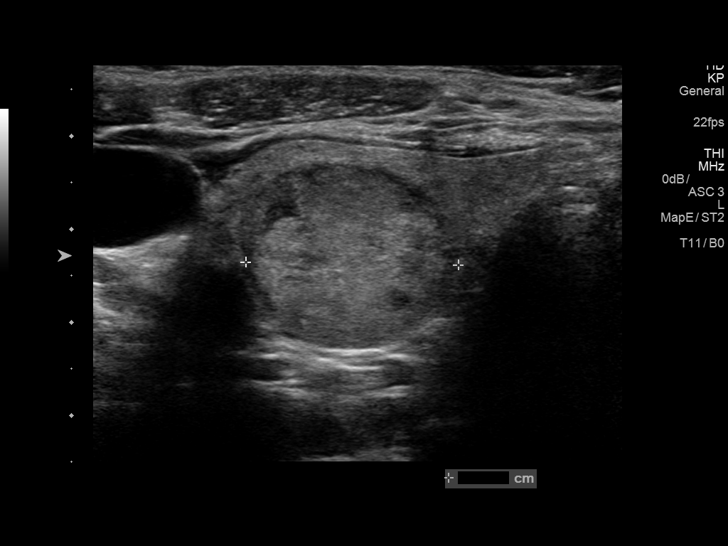
[im 10/56]
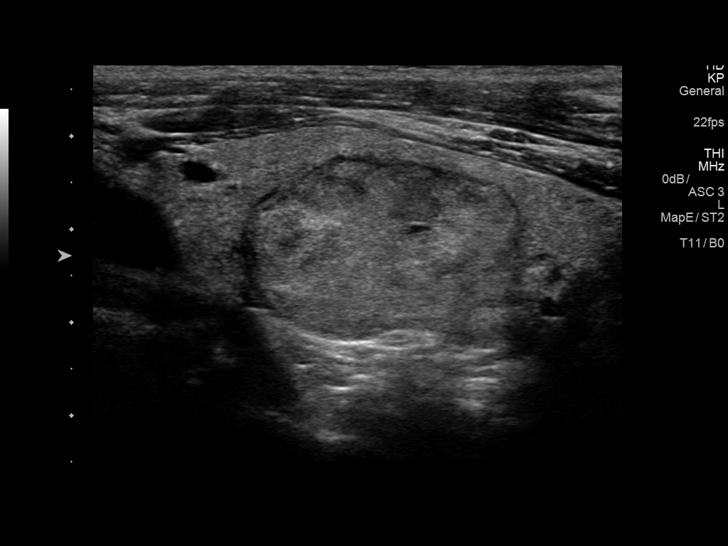
[im 14/56]
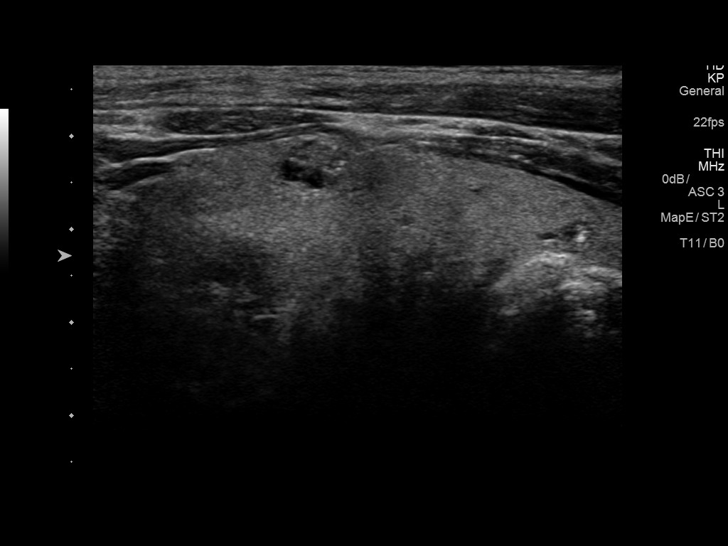
[im 19/56]
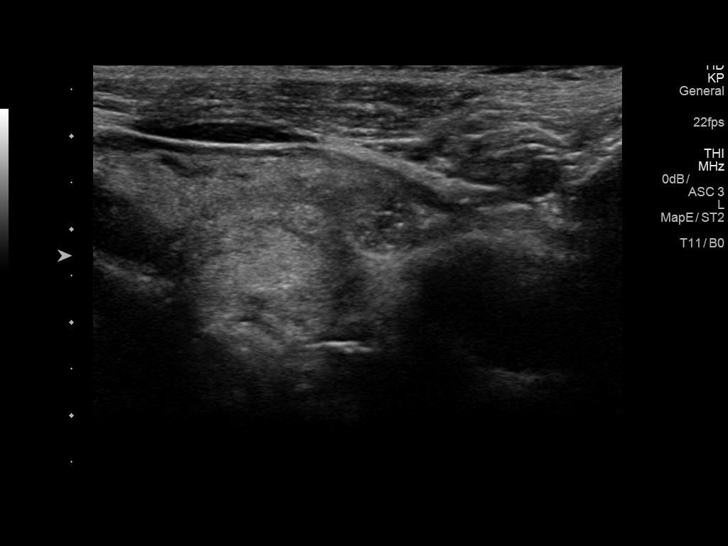
[im 23/56]
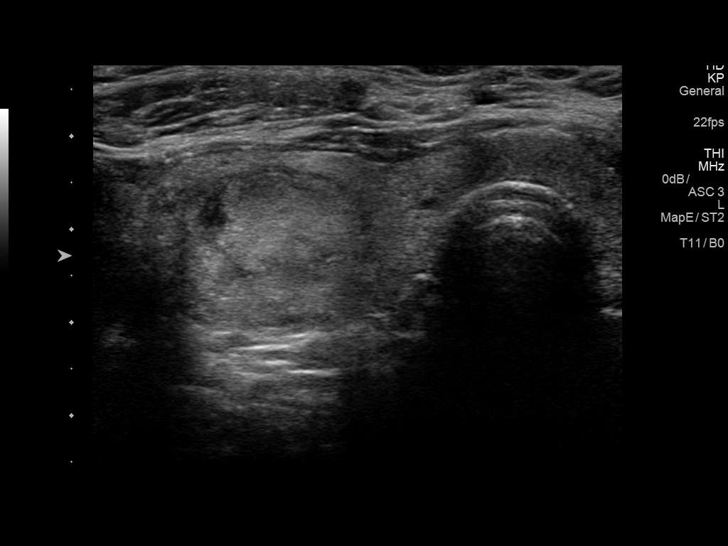
[im 28/56]
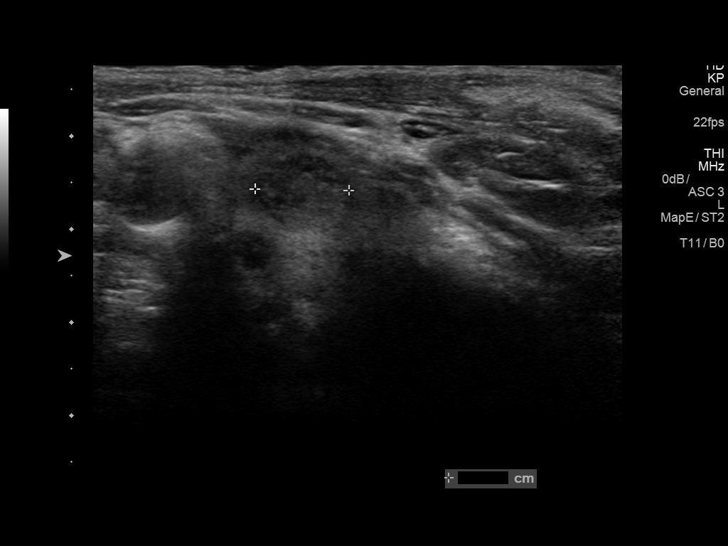
[im 33/56]
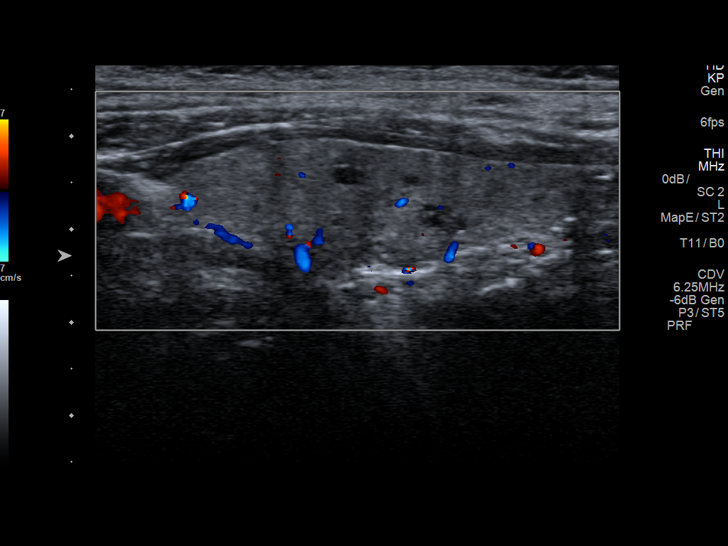
[im 37/56]
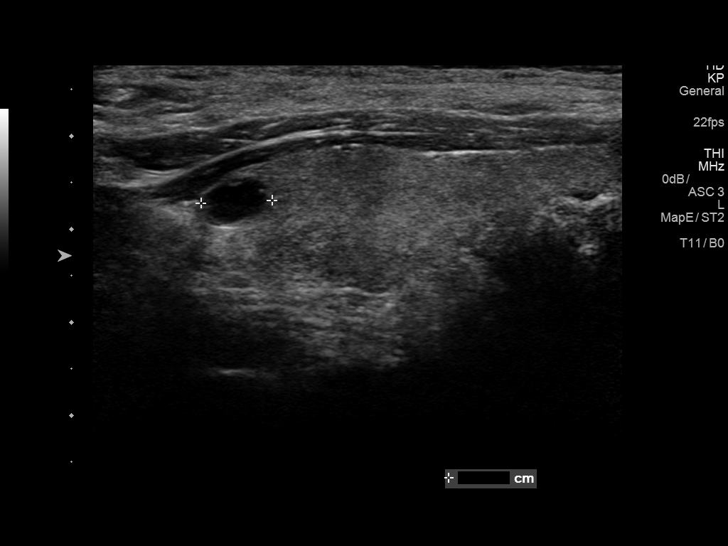
[im 42/56]
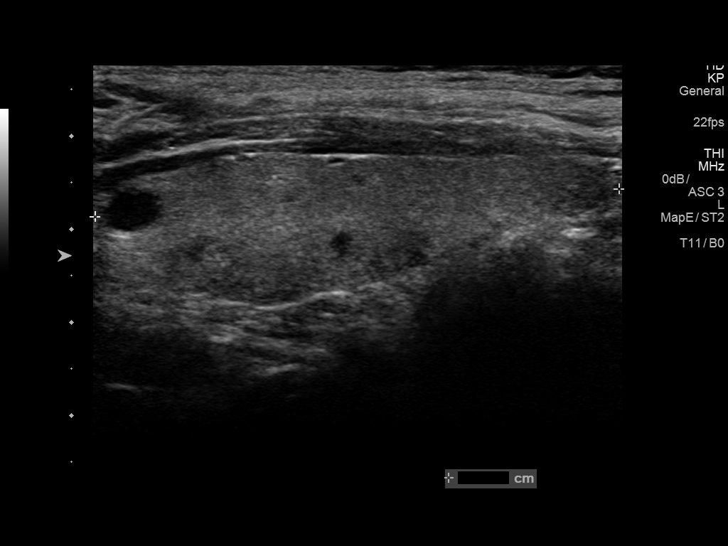
[im 46/56]
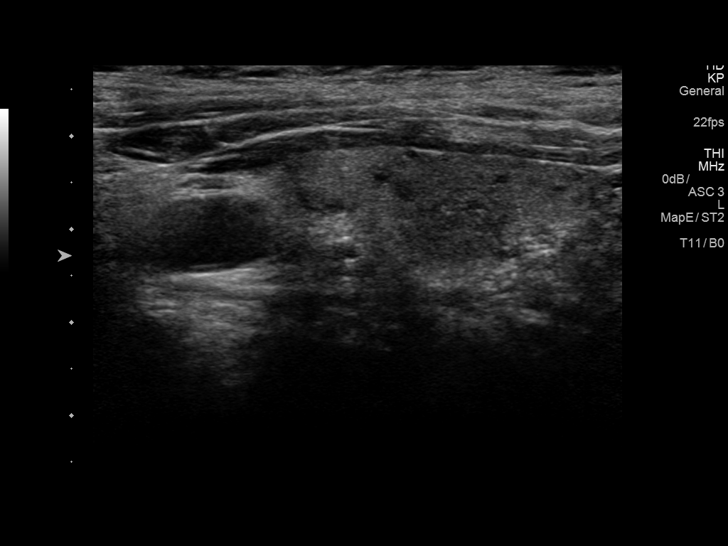
[im 51/56]
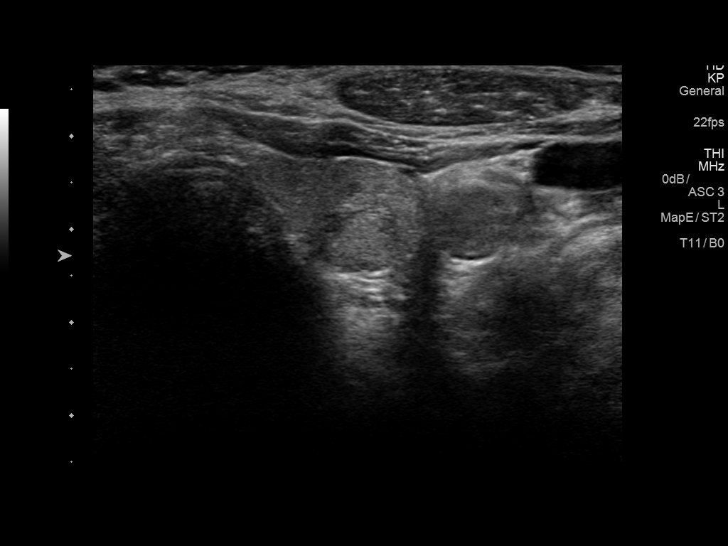
[im 56/56]
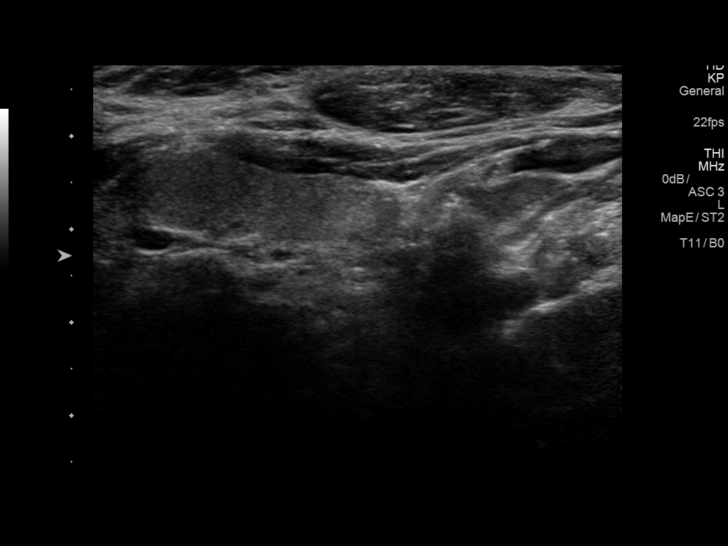

[13 of 25 positions shown; findings below may reference images not displayed]

FINDINGS: Parenchymal Echotexture: Mildly heterogenous

Isthmus: 0.4 cm thickness, previously

Right lobe: 5.3 x 2.3 x 3.1 cm, previously 6.2 x 2.4 x 3

Left lobe: 5.6 x 1.3 x 1.7 cm, previously 5.4 x 1.5 x

_________________________________________________________

Estimated total number of nodules >/= 1 cm: 3

Number of spongiform nodules >/=  2 cm not described below (TR1): 0

Number of mixed cystic and solid nodules >/= 1.5 cm not described
below (TR2): 0

_________________________________________________________

Nodule # 1:

Prior biopsy: No

Location: Right; Mid

Maximum size: 2.9 cm; Other 2 dimensions: 2 x 2.3 cm, previously,
2.8 x 2.4 x 1.9 on 04/27/2011

Composition: solid/almost completely solid (2)

Echogenicity: hypoechoic (2)

Shape: not taller-than-wide (0)

Margins: smooth (0)

Echogenic foci: none (0)

ACR TI-RADS total points: 4.

ACR TI-RADS risk category:  TR4 (4-6 points).

Significant change in size (>/= 20% in two dimensions and minimal
increase of 2 mm): No

Change in features: No

Change in ACR TI-RADS risk category: No

ACR TI-RADS recommendations:

Stability times greater than 5 years implies benignity, no biopsy or
follow-up recommended.

_________________________________________________________

Nodule # 2:

Prior biopsy: No

Location: Left; Inferior

Maximum size: 1.1 cm; Other 2 dimensions: 0.8 x 1 cm, previously,
1.1 x 0.8 x 0.7 cm

Composition: solid/almost completely solid (2)

Echogenicity: isoechoic (1)

Shape: not taller-than-wide (0)

Margins: ill-defined (0)

Echogenic foci: none (0)

ACR TI-RADS total points: 3.

ACR TI-RADS risk category:  TR3 (3 points).

Significant change in size (>/= 20% in two dimensions and minimal
increase of 2 mm): No

Change in features: No

Change in ACR TI-RADS risk category: No

ACR TI-RADS recommendations:

Given size (<1.4 cm) and appearance, this nodule does NOT meet
TI-RADS criteria for biopsy or dedicated follow-up.

_________________________________________________________

1 x 0.8 x 0.9 cm inferior right nodule with punctate echogenic foci,
previously 1 x 0.9 x 0.7 on 04/27/2011.

0.7 cm hypoechoic nodule without calcifications, mid right

0.7 cm colloid cyst, superior left
IMPRESSION: 1. Thyromegaly with bilateral nodules as above. None meets criteria
for biopsy or dedicated imaging follow-up.

The above is in keeping with the ACR TI-RADS recommendations - [HOSPITAL] 4295;[DATE].

## 2018-05-10 ENCOUNTER — Ambulatory Visit
Admission: RE | Admit: 2018-05-10 | Discharge: 2018-05-10 | Disposition: A | Discharge status: Home / Self Care | Payer: Medicare Other | Source: Ambulatory Visit | Attending: Radiation Oncology | Admitting: Radiation Oncology

## 2018-05-10 DIAGNOSIS — Z510 Encounter for antineoplastic radiation therapy: Secondary | ICD-10-CM | POA: Diagnosis not present

## 2018-05-10 DIAGNOSIS — C61 Malignant neoplasm of prostate: Secondary | ICD-10-CM | POA: Diagnosis not present

## 2018-05-11 ENCOUNTER — Ambulatory Visit
Admission: RE | Admit: 2018-05-11 | Discharge: 2018-05-11 | Disposition: A | Discharge status: Home / Self Care | Payer: Medicare Other | Source: Ambulatory Visit | Attending: Radiation Oncology | Admitting: Radiation Oncology

## 2018-05-11 DIAGNOSIS — Z510 Encounter for antineoplastic radiation therapy: Secondary | ICD-10-CM | POA: Diagnosis not present

## 2018-05-11 DIAGNOSIS — C61 Malignant neoplasm of prostate: Secondary | ICD-10-CM | POA: Diagnosis not present

## 2018-05-14 ENCOUNTER — Ambulatory Visit
Admission: RE | Admit: 2018-05-14 | Discharge: 2018-05-14 | Disposition: A | Discharge status: Home / Self Care | Payer: Medicare Other | Source: Ambulatory Visit | Attending: Radiation Oncology | Admitting: Radiation Oncology

## 2018-05-14 DIAGNOSIS — Z510 Encounter for antineoplastic radiation therapy: Secondary | ICD-10-CM | POA: Diagnosis not present

## 2018-05-14 DIAGNOSIS — C61 Malignant neoplasm of prostate: Secondary | ICD-10-CM | POA: Diagnosis not present

## 2018-05-15 ENCOUNTER — Ambulatory Visit
Admission: RE | Admit: 2018-05-15 | Discharge: 2018-05-15 | Disposition: A | Discharge status: Home / Self Care | Payer: Medicare Other | Source: Ambulatory Visit | Attending: Radiation Oncology | Admitting: Radiation Oncology

## 2018-05-15 DIAGNOSIS — Z510 Encounter for antineoplastic radiation therapy: Secondary | ICD-10-CM | POA: Diagnosis not present

## 2018-05-15 DIAGNOSIS — C61 Malignant neoplasm of prostate: Secondary | ICD-10-CM | POA: Diagnosis not present

## 2018-05-17 ENCOUNTER — Ambulatory Visit
Admission: RE | Admit: 2018-05-17 | Discharge: 2018-05-17 | Disposition: A | Discharge status: Home / Self Care | Payer: Medicare Other | Source: Ambulatory Visit | Attending: Radiation Oncology | Admitting: Radiation Oncology

## 2018-05-17 DIAGNOSIS — Z510 Encounter for antineoplastic radiation therapy: Secondary | ICD-10-CM | POA: Diagnosis not present

## 2018-05-17 DIAGNOSIS — C61 Malignant neoplasm of prostate: Secondary | ICD-10-CM | POA: Insufficient documentation

## 2018-05-18 ENCOUNTER — Ambulatory Visit
Admission: RE | Admit: 2018-05-18 | Discharge: 2018-05-18 | Disposition: A | Discharge status: Home / Self Care | Payer: Medicare Other | Source: Ambulatory Visit | Attending: Radiation Oncology | Admitting: Radiation Oncology

## 2018-05-18 DIAGNOSIS — Z510 Encounter for antineoplastic radiation therapy: Secondary | ICD-10-CM | POA: Diagnosis not present

## 2018-05-18 DIAGNOSIS — C61 Malignant neoplasm of prostate: Secondary | ICD-10-CM | POA: Diagnosis not present

## 2018-05-21 ENCOUNTER — Ambulatory Visit
Admission: RE | Admit: 2018-05-21 | Discharge: 2018-05-21 | Disposition: A | Discharge status: Home / Self Care | Payer: Medicare Other | Source: Ambulatory Visit | Attending: Radiation Oncology | Admitting: Radiation Oncology

## 2018-05-21 DIAGNOSIS — C61 Malignant neoplasm of prostate: Secondary | ICD-10-CM | POA: Diagnosis not present

## 2018-05-21 DIAGNOSIS — Z510 Encounter for antineoplastic radiation therapy: Secondary | ICD-10-CM | POA: Diagnosis not present

## 2018-05-22 ENCOUNTER — Ambulatory Visit
Admission: RE | Admit: 2018-05-22 | Discharge: 2018-05-22 | Disposition: A | Discharge status: Home / Self Care | Payer: Medicare Other | Source: Ambulatory Visit | Attending: Radiation Oncology | Admitting: Radiation Oncology

## 2018-05-22 ENCOUNTER — Encounter: Payer: Self-pay | Admitting: Medical Oncology

## 2018-05-22 DIAGNOSIS — C61 Malignant neoplasm of prostate: Secondary | ICD-10-CM | POA: Diagnosis not present

## 2018-05-22 DIAGNOSIS — Z510 Encounter for antineoplastic radiation therapy: Secondary | ICD-10-CM | POA: Diagnosis not present

## 2018-05-22 NOTE — Progress Notes (Signed)
Celebrated with Mr. Lukas and his wife as the rang the bell after completing radiation treatments. He states he has done well but very happy to be finished. He is scheduled for follow up with Ashlyn-PA 2/12 at 2:30 pm. Encouraged him to call with questions or concerns. He voiced understanding.

## 2018-05-22 NOTE — Progress Notes (Signed)
FMLA successfully faxed to [employer] at [phone]. Mailed copy to patient address on file.

## 2018-05-25 ENCOUNTER — Encounter: Payer: Self-pay | Admitting: Radiation Oncology

## 2018-05-25 NOTE — Progress Notes (Signed)
  Radiation Oncology         (336) [phone] ________________________________  Name: Jose Schmitt MRN: [id number]  Date: 05/25/2018  DOB: Aug 29, 1940  End of Treatment Note  Diagnosis:   78 y.o. gentleman with Stage T2a adenocarcinoma of the prostate with Gleason Score of 4+3, and PSA of 5.2.     Indication for treatment:  Curative, Definitive Radiotherapy       Radiation treatment dates:   04/09/2018 - 05/22/2018  Site/dose:   The prostate was treated to 70 Gy in 28 fractions of 2.5 Gy  Beams/energy:   The patient was treated with IMRT using volumetric arc therapy delivering 6 MV X-rays to clockwise and counterclockwise circumferential arcs with a 90 degree collimator offset to avoid dose scalloping.  Image guidance was performed with daily cone beam CT prior to each fraction to align to gold markers in the prostate and assure proper bladder and rectal fill volumes.  Immobilization was achieved with [employer] custom mold.  Narrative: The patient tolerated radiation treatment relatively well. He reported dysuria but denied hematuria, leakage, or bowel issues throughout treatment. He denied nocturia until closer to the end when he endorsed x1. He experienced intermittent urgency, fatigue, and incomplete emptying during treatments which was improved with Flomax.   Plan: The patient has completed radiation treatment. He will return to radiation oncology clinic for routine followup in one month. I advised him to call or return sooner if he has any questions or concerns related to his recovery or treatment. ________________________________  Sheral Apley. Tammi Klippel, M.D.  This document serves as a record of services personally performed by [name], MD. It was created on his behalf by Wilburn Mylar, a trained medical scribe. The creation of this record is based on the scribe's personal observations and the provider's statements to them. This document has been checked and approved by the attending  provider.

## 2018-06-27 ENCOUNTER — Encounter: Payer: Self-pay | Admitting: Urology

## 2018-06-27 ENCOUNTER — Ambulatory Visit
Admission: RE | Admit: 2018-06-27 | Discharge: 2018-06-27 | Disposition: A | Discharge status: Home / Self Care | Payer: Medicare Other | Source: Ambulatory Visit | Attending: Urology | Admitting: Urology

## 2018-06-27 ENCOUNTER — Other Ambulatory Visit: Payer: Self-pay

## 2018-06-27 VITALS — BP 134/74 | HR 87 | Temp 99.4°F | Resp 20 | Ht 71.0 in | Wt 210.0 lb

## 2018-06-27 DIAGNOSIS — Z79899 Other long term (current) drug therapy: Secondary | ICD-10-CM | POA: Insufficient documentation

## 2018-06-27 DIAGNOSIS — C61 Malignant neoplasm of prostate: Secondary | ICD-10-CM

## 2018-06-27 DIAGNOSIS — Z923 Personal history of irradiation: Secondary | ICD-10-CM | POA: Diagnosis not present

## 2018-06-27 NOTE — Progress Notes (Signed)
Radiation Oncology         [phone] ________________________________  Name: PASTOR SGRO MRN: [id number]  Date: 06/27/2018  DOB: 1940-12-19  Post Treatment Note  CC: Hulan Fess, MD  Hulan Fess, MD  Diagnosis:   78 y.o. gentleman with Stage T2a adenocarcinoma of the prostate with Gleason Score of 4+3, and PSA of 5.2.  Interval Since Last Radiation:  5 weeks  04/09/2018 - 05/22/2018: The prostate was treated to 70 Gy in 28 fractions of 2.5 Gy  Narrative:  The patient returns today for routine follow-up.  He tolerated radiation treatment relatively well. He reported dysuria but denied hematuria, leakage, or bowel issues throughout treatment. He denied nocturia until closer to the end when he endorsed x1. He experienced intermittent urgency, fatigue, and incomplete emptying during treatments which was improved with Flomax.                              On review of systems, the patient states that he is doing very well overall.  He reports gradual improvement in his LUTS with a current IPSS score of 21 indicating moderate to severe urinary symptoms with weak stream, urgency, intermittency and daytime frequency.  He specifically denies dysuria, gross hematuria, incomplete bladder emptying or incontinence.  He reports nocturia x1 per night.  He has continued taking Flomax but is only able to tolerate taking it every 2 to 3 days instead of daily due to nasal congestion.  He does notice significant improvement in his flow of stream, hesitancy and intermittency when he is taking the Flomax.  He has not had any issues with his bowels throughout treatment and reports that he has continued with normal, daily bowel movements.  He denies abdominal pain, nausea, vomiting, diarrhea or constipation.  He reports a healthy appetite and is maintaining his weight.  ALLERGIES:  is allergic to contrast media [iodinated diagnostic agents].  Meds: Current Outpatient Medications  Medication Sig Dispense Refill    . aspirin 325 MG tablet Take 325 mg by mouth daily.    . diazepam (VALIUM) 5 MG tablet Take 1 tablet (5 mg total) by mouth as needed for anxiety (30 minutes prior to MRI scan, may repeat once after 30 minutes if needed.). 10 tablet 0  . fish oil-omega-3 fatty acids 1000 MG capsule Take 1 g by mouth 2 (two) times daily.    . hydrochlorothiazide (HYDRODIURIL) 25 MG tablet Take 25 mg by mouth daily.    Marland Kitchen lisinopril (PRINIVIL,ZESTRIL) 20 MG tablet Take 20 mg by mouth daily.    . naproxen sodium (ANAPROX) 220 MG tablet Take 220-440 mg by mouth 2 (two) times daily as needed. For knee pain    . ranitidine (ZANTAC) 150 MG tablet Take 150 mg by mouth daily.    . simvastatin (ZOCOR) 40 MG tablet TK 1 T PO QPM.  3  . simvastatin (ZOCOR) 80 MG tablet Take 80 mg by mouth at bedtime.    . tamsulosin (FLOMAX) 0.4 MG CAPS capsule Take 1 capsule (0.4 mg total) by mouth daily after supper. 30 capsule 5  . timolol (BETIMOL) 0.25 % ophthalmic solution 1-2 drops 2 (two) times daily.    . TRAVATAN Z 0.004 % SOLN ophthalmic solution APP 1 GTT INTO OU HS  3   No current facility-administered medications for this encounter.     Physical Findings:  height is 5\' 11"  (1.803 m) and weight is 210 lb (95.3 kg).  His oral temperature is 99.4 F (37.4 C). His blood pressure is 134/74 and his pulse is 87. His respiration is 20 and oxygen saturation is 96%.  Pain Assessment Pain Score: 0-No pain/10 In general this is a well appearing Caucasian male in no acute distress.  He's alert and oriented x4 and appropriate throughout the examination. Cardiopulmonary assessment is negative for acute distress and he exhibits normal effort.   Lab Findings: Lab Results  Component Value Date   WBC 9.0 07/26/2011   HGB 16.5 07/26/2011   HCT 46.5 07/26/2011   MCV 91.5 07/26/2011   PLT 254 07/26/2011     Radiographic Findings: No results found.  Impression/Plan: 1. 79 y.o. gentleman with Stage T2a adenocarcinoma of the prostate  with Gleason Score of 4+3, and PSA of 5.2.   He will continue to follow up with urology for ongoing PSA determinations and has an appointment scheduled with Dr. Gloriann Loan on 07/26/2018. He understands what to expect with regards to PSA monitoring going forward. I will look forward to following his response to treatment via correspondence with urology, and would be happy to continue to participate in his care if clinically indicated. I talked to the patient about what to expect in the future, including his risk for erectile dysfunction and rectal bleeding. I encouraged him to call or return to the office if he has any questions regarding his previous radiation or possible radiation side effects. He was comfortable with this plan and will follow up as needed.    Nicholos Johns, PA-C

## 2018-07-03 DIAGNOSIS — J069 Acute upper respiratory infection, unspecified: Secondary | ICD-10-CM | POA: Diagnosis not present

## 2018-07-18 DIAGNOSIS — H401111 Primary open-angle glaucoma, right eye, mild stage: Secondary | ICD-10-CM | POA: Diagnosis not present

## 2018-07-18 DIAGNOSIS — H401122 Primary open-angle glaucoma, left eye, moderate stage: Secondary | ICD-10-CM | POA: Diagnosis not present

## 2018-07-26 DIAGNOSIS — C61 Malignant neoplasm of prostate: Secondary | ICD-10-CM | POA: Diagnosis not present

## 2018-07-26 DIAGNOSIS — N403 Nodular prostate with lower urinary tract symptoms: Secondary | ICD-10-CM | POA: Diagnosis not present

## 2018-10-26 DIAGNOSIS — C61 Malignant neoplasm of prostate: Secondary | ICD-10-CM | POA: Diagnosis not present

## 2018-11-02 DIAGNOSIS — C61 Malignant neoplasm of prostate: Secondary | ICD-10-CM | POA: Diagnosis not present

## 2018-11-02 DIAGNOSIS — R3912 Poor urinary stream: Secondary | ICD-10-CM | POA: Diagnosis not present

## 2018-11-06 DIAGNOSIS — H401131 Primary open-angle glaucoma, bilateral, mild stage: Secondary | ICD-10-CM | POA: Diagnosis not present

## 2018-11-06 DIAGNOSIS — H5203 Hypermetropia, bilateral: Secondary | ICD-10-CM | POA: Diagnosis not present

## 2018-11-06 DIAGNOSIS — H52223 Regular astigmatism, bilateral: Secondary | ICD-10-CM | POA: Diagnosis not present

## 2018-11-06 DIAGNOSIS — H524 Presbyopia: Secondary | ICD-10-CM | POA: Diagnosis not present

## 2018-11-21 DIAGNOSIS — I10 Essential (primary) hypertension: Secondary | ICD-10-CM | POA: Diagnosis not present

## 2018-11-21 DIAGNOSIS — Z8546 Personal history of malignant neoplasm of prostate: Secondary | ICD-10-CM | POA: Diagnosis not present

## 2018-11-21 DIAGNOSIS — E538 Deficiency of other specified B group vitamins: Secondary | ICD-10-CM | POA: Diagnosis not present

## 2018-11-21 DIAGNOSIS — E782 Mixed hyperlipidemia: Secondary | ICD-10-CM | POA: Diagnosis not present

## 2018-11-21 DIAGNOSIS — R7301 Impaired fasting glucose: Secondary | ICD-10-CM | POA: Diagnosis not present

## 2018-11-21 DIAGNOSIS — E042 Nontoxic multinodular goiter: Secondary | ICD-10-CM | POA: Diagnosis not present

## 2018-11-21 DIAGNOSIS — H401194 Primary open-angle glaucoma, unspecified eye, indeterminate stage: Secondary | ICD-10-CM | POA: Diagnosis not present

## 2018-12-14 DIAGNOSIS — E782 Mixed hyperlipidemia: Secondary | ICD-10-CM | POA: Diagnosis not present

## 2018-12-14 DIAGNOSIS — Z8546 Personal history of malignant neoplasm of prostate: Secondary | ICD-10-CM | POA: Diagnosis not present

## 2018-12-14 DIAGNOSIS — E538 Deficiency of other specified B group vitamins: Secondary | ICD-10-CM | POA: Diagnosis not present

## 2018-12-14 DIAGNOSIS — Z23 Encounter for immunization: Secondary | ICD-10-CM | POA: Diagnosis not present

## 2018-12-14 DIAGNOSIS — E042 Nontoxic multinodular goiter: Secondary | ICD-10-CM | POA: Diagnosis not present

## 2018-12-14 DIAGNOSIS — I10 Essential (primary) hypertension: Secondary | ICD-10-CM | POA: Diagnosis not present

## 2018-12-14 DIAGNOSIS — R7301 Impaired fasting glucose: Secondary | ICD-10-CM | POA: Diagnosis not present

## 2018-12-14 DIAGNOSIS — H401194 Primary open-angle glaucoma, unspecified eye, indeterminate stage: Secondary | ICD-10-CM | POA: Diagnosis not present

## 2019-01-09 DIAGNOSIS — X32XXXD Exposure to sunlight, subsequent encounter: Secondary | ICD-10-CM | POA: Diagnosis not present

## 2019-01-09 DIAGNOSIS — L820 Inflamed seborrheic keratosis: Secondary | ICD-10-CM | POA: Diagnosis not present

## 2019-01-09 DIAGNOSIS — L570 Actinic keratosis: Secondary | ICD-10-CM | POA: Diagnosis not present

## 2019-02-12 DIAGNOSIS — C61 Malignant neoplasm of prostate: Secondary | ICD-10-CM | POA: Diagnosis not present

## 2019-02-12 DIAGNOSIS — R300 Dysuria: Secondary | ICD-10-CM | POA: Diagnosis not present

## 2019-02-13 DIAGNOSIS — H401132 Primary open-angle glaucoma, bilateral, moderate stage: Secondary | ICD-10-CM | POA: Diagnosis not present

## 2019-02-13 DIAGNOSIS — H401112 Primary open-angle glaucoma, right eye, moderate stage: Secondary | ICD-10-CM | POA: Diagnosis not present

## 2019-02-13 DIAGNOSIS — Z961 Presence of intraocular lens: Secondary | ICD-10-CM | POA: Diagnosis not present

## 2019-02-13 DIAGNOSIS — H524 Presbyopia: Secondary | ICD-10-CM | POA: Diagnosis not present

## 2019-02-13 DIAGNOSIS — H52223 Regular astigmatism, bilateral: Secondary | ICD-10-CM | POA: Diagnosis not present

## 2019-02-13 DIAGNOSIS — H5203 Hypermetropia, bilateral: Secondary | ICD-10-CM | POA: Diagnosis not present

## 2019-02-13 DIAGNOSIS — H401122 Primary open-angle glaucoma, left eye, moderate stage: Secondary | ICD-10-CM | POA: Diagnosis not present

## 2019-02-26 DIAGNOSIS — Z23 Encounter for immunization: Secondary | ICD-10-CM | POA: Diagnosis not present

## 2019-04-03 DIAGNOSIS — Z23 Encounter for immunization: Secondary | ICD-10-CM | POA: Diagnosis not present

## 2019-05-16 DIAGNOSIS — C61 Malignant neoplasm of prostate: Secondary | ICD-10-CM | POA: Diagnosis not present

## 2019-05-23 DIAGNOSIS — C61 Malignant neoplasm of prostate: Secondary | ICD-10-CM | POA: Diagnosis not present

## 2019-05-31 ENCOUNTER — Ambulatory Visit: Payer: Medicare Other | Attending: Internal Medicine

## 2019-05-31 DIAGNOSIS — Z23 Encounter for immunization: Secondary | ICD-10-CM | POA: Insufficient documentation

## 2019-05-31 NOTE — Progress Notes (Signed)
   Covid-19 Vaccination Clinic  Name:  Jose Schmitt    MRN: [id number] DOB: Apr 03, 1941  05/31/2019  [name] was observed post Covid-19 immunization for 15 minutes without incidence. He was provided with Vaccine Information Sheet and instruction to access the V-Safe system.   [name] was instructed to call 911 with any severe reactions post vaccine: Marland Kitchen Difficulty breathing  . Swelling of your face and throat  . A fast heartbeat  . A bad rash all over your body  . Dizziness and weakness    Immunizations Administered    Name Date Dose VIS Date Route   Pfizer COVID-19 Vaccine 05/31/2019 10:27 AM 0.3 mL 04/26/2019 Intramuscular   Manufacturer: [place]   Lot: [id number]   [place]: [id number]

## 2019-06-19 ENCOUNTER — Ambulatory Visit: Payer: Medicare Other | Attending: Internal Medicine

## 2019-06-19 DIAGNOSIS — Z23 Encounter for immunization: Secondary | ICD-10-CM

## 2019-06-19 NOTE — Progress Notes (Signed)
   Covid-19 Vaccination Clinic  Name:  Jose Schmitt    MRN: [id number] DOB: 02-12-1941  06/19/2019  Mr. Jose Schmitt was observed post Covid-19 immunization for 15 minutes without incidence. He was provided with Vaccine Information Sheet and instruction to access the V-Safe system.   Mr. Jose Schmitt was instructed to call 911 with any severe reactions post vaccine: Marland Kitchen Difficulty breathing  . Swelling of your face and throat  . A fast heartbeat  . A bad rash all over your body  . Dizziness and weakness    Immunizations Administered    Name Date Dose VIS Date Route   Pfizer COVID-19 Vaccine 06/19/2019 11:40 AM 0.3 mL 04/26/2019 Intramuscular   Manufacturer: [place]   Lot: [id number]   [place]: [id number]

## 2019-07-12 DIAGNOSIS — E782 Mixed hyperlipidemia: Secondary | ICD-10-CM | POA: Diagnosis not present

## 2019-07-12 DIAGNOSIS — I10 Essential (primary) hypertension: Secondary | ICD-10-CM | POA: Diagnosis not present

## 2019-07-12 DIAGNOSIS — E538 Deficiency of other specified B group vitamins: Secondary | ICD-10-CM | POA: Diagnosis not present

## 2019-07-12 DIAGNOSIS — Z8546 Personal history of malignant neoplasm of prostate: Secondary | ICD-10-CM | POA: Diagnosis not present

## 2019-07-12 DIAGNOSIS — H401194 Primary open-angle glaucoma, unspecified eye, indeterminate stage: Secondary | ICD-10-CM | POA: Diagnosis not present

## 2019-07-12 DIAGNOSIS — Z0000 Encounter for general adult medical examination without abnormal findings: Secondary | ICD-10-CM | POA: Diagnosis not present

## 2019-07-12 DIAGNOSIS — R7301 Impaired fasting glucose: Secondary | ICD-10-CM | POA: Diagnosis not present

## 2019-07-12 DIAGNOSIS — E042 Nontoxic multinodular goiter: Secondary | ICD-10-CM | POA: Diagnosis not present

## 2019-07-12 DIAGNOSIS — Z23 Encounter for immunization: Secondary | ICD-10-CM | POA: Diagnosis not present

## 2019-07-31 DIAGNOSIS — Z1283 Encounter for screening for malignant neoplasm of skin: Secondary | ICD-10-CM | POA: Diagnosis not present

## 2019-07-31 DIAGNOSIS — L820 Inflamed seborrheic keratosis: Secondary | ICD-10-CM | POA: Diagnosis not present

## 2019-07-31 DIAGNOSIS — L570 Actinic keratosis: Secondary | ICD-10-CM | POA: Diagnosis not present

## 2019-07-31 DIAGNOSIS — X32XXXD Exposure to sunlight, subsequent encounter: Secondary | ICD-10-CM | POA: Diagnosis not present

## 2019-07-31 DIAGNOSIS — L821 Other seborrheic keratosis: Secondary | ICD-10-CM | POA: Diagnosis not present

## 2019-08-14 DIAGNOSIS — H40011 Open angle with borderline findings, low risk, right eye: Secondary | ICD-10-CM | POA: Diagnosis not present

## 2019-08-14 DIAGNOSIS — H5340 Unspecified visual field defects: Secondary | ICD-10-CM | POA: Diagnosis not present

## 2019-08-14 DIAGNOSIS — H401131 Primary open-angle glaucoma, bilateral, mild stage: Secondary | ICD-10-CM | POA: Diagnosis not present

## 2019-08-20 DIAGNOSIS — C61 Malignant neoplasm of prostate: Secondary | ICD-10-CM | POA: Diagnosis not present

## 2019-11-04 DIAGNOSIS — E782 Mixed hyperlipidemia: Secondary | ICD-10-CM | POA: Diagnosis not present

## 2019-11-14 DIAGNOSIS — C61 Malignant neoplasm of prostate: Secondary | ICD-10-CM | POA: Diagnosis not present

## 2020-02-17 DIAGNOSIS — Z23 Encounter for immunization: Secondary | ICD-10-CM | POA: Diagnosis not present

## 2020-02-27 DIAGNOSIS — Z9849 Cataract extraction status, unspecified eye: Secondary | ICD-10-CM | POA: Diagnosis not present

## 2020-02-27 DIAGNOSIS — H401131 Primary open-angle glaucoma, bilateral, mild stage: Secondary | ICD-10-CM | POA: Diagnosis not present

## 2020-02-27 DIAGNOSIS — H401122 Primary open-angle glaucoma, left eye, moderate stage: Secondary | ICD-10-CM | POA: Diagnosis not present

## 2020-02-27 DIAGNOSIS — Z961 Presence of intraocular lens: Secondary | ICD-10-CM | POA: Diagnosis not present

## 2020-02-27 DIAGNOSIS — D1801 Hemangioma of skin and subcutaneous tissue: Secondary | ICD-10-CM | POA: Diagnosis not present

## 2020-02-27 DIAGNOSIS — H524 Presbyopia: Secondary | ICD-10-CM | POA: Diagnosis not present

## 2020-02-27 DIAGNOSIS — H5203 Hypermetropia, bilateral: Secondary | ICD-10-CM | POA: Diagnosis not present

## 2020-02-27 DIAGNOSIS — H40013 Open angle with borderline findings, low risk, bilateral: Secondary | ICD-10-CM | POA: Diagnosis not present

## 2020-02-27 DIAGNOSIS — H52223 Regular astigmatism, bilateral: Secondary | ICD-10-CM | POA: Diagnosis not present

## 2020-03-09 DIAGNOSIS — Z23 Encounter for immunization: Secondary | ICD-10-CM | POA: Diagnosis not present

## 2020-05-18 DIAGNOSIS — C61 Malignant neoplasm of prostate: Secondary | ICD-10-CM | POA: Diagnosis not present

## 2020-08-14 DIAGNOSIS — Z1389 Encounter for screening for other disorder: Secondary | ICD-10-CM | POA: Diagnosis not present

## 2020-08-14 DIAGNOSIS — Z0000 Encounter for general adult medical examination without abnormal findings: Secondary | ICD-10-CM | POA: Diagnosis not present

## 2020-08-18 DIAGNOSIS — E538 Deficiency of other specified B group vitamins: Secondary | ICD-10-CM | POA: Diagnosis not present

## 2020-08-18 DIAGNOSIS — I10 Essential (primary) hypertension: Secondary | ICD-10-CM | POA: Diagnosis not present

## 2020-08-18 DIAGNOSIS — E041 Nontoxic single thyroid nodule: Secondary | ICD-10-CM | POA: Diagnosis not present

## 2020-08-18 DIAGNOSIS — E782 Mixed hyperlipidemia: Secondary | ICD-10-CM | POA: Diagnosis not present

## 2020-08-20 DIAGNOSIS — E782 Mixed hyperlipidemia: Secondary | ICD-10-CM | POA: Diagnosis not present

## 2020-08-20 DIAGNOSIS — E041 Nontoxic single thyroid nodule: Secondary | ICD-10-CM | POA: Diagnosis not present

## 2020-08-20 DIAGNOSIS — I10 Essential (primary) hypertension: Secondary | ICD-10-CM | POA: Diagnosis not present

## 2020-08-20 DIAGNOSIS — E538 Deficiency of other specified B group vitamins: Secondary | ICD-10-CM | POA: Diagnosis not present

## 2020-08-21 DIAGNOSIS — H52223 Regular astigmatism, bilateral: Secondary | ICD-10-CM | POA: Diagnosis not present

## 2020-08-21 DIAGNOSIS — H40011 Open angle with borderline findings, low risk, right eye: Secondary | ICD-10-CM | POA: Diagnosis not present

## 2020-08-21 DIAGNOSIS — H524 Presbyopia: Secondary | ICD-10-CM | POA: Diagnosis not present

## 2020-08-21 DIAGNOSIS — H40012 Open angle with borderline findings, low risk, left eye: Secondary | ICD-10-CM | POA: Diagnosis not present

## 2020-08-21 DIAGNOSIS — H401111 Primary open-angle glaucoma, right eye, mild stage: Secondary | ICD-10-CM | POA: Diagnosis not present

## 2020-08-21 DIAGNOSIS — H5203 Hypermetropia, bilateral: Secondary | ICD-10-CM | POA: Diagnosis not present

## 2020-08-21 DIAGNOSIS — S0512XS Contusion of eyeball and orbital tissues, left eye, sequela: Secondary | ICD-10-CM | POA: Diagnosis not present

## 2020-11-19 DIAGNOSIS — C61 Malignant neoplasm of prostate: Secondary | ICD-10-CM | POA: Diagnosis not present

## 2020-11-26 DIAGNOSIS — C61 Malignant neoplasm of prostate: Secondary | ICD-10-CM | POA: Diagnosis not present

## 2021-02-17 DIAGNOSIS — I10 Essential (primary) hypertension: Secondary | ICD-10-CM | POA: Diagnosis not present

## 2021-03-02 DIAGNOSIS — H5203 Hypermetropia, bilateral: Secondary | ICD-10-CM | POA: Diagnosis not present

## 2021-03-02 DIAGNOSIS — H401122 Primary open-angle glaucoma, left eye, moderate stage: Secondary | ICD-10-CM | POA: Diagnosis not present

## 2021-03-02 DIAGNOSIS — H524 Presbyopia: Secondary | ICD-10-CM | POA: Diagnosis not present

## 2021-03-02 DIAGNOSIS — H401132 Primary open-angle glaucoma, bilateral, moderate stage: Secondary | ICD-10-CM | POA: Diagnosis not present

## 2021-03-02 DIAGNOSIS — C4922 Malignant neoplasm of connective and soft tissue of left lower limb, including hip: Secondary | ICD-10-CM | POA: Diagnosis not present

## 2021-03-02 DIAGNOSIS — C499 Malignant neoplasm of connective and soft tissue, unspecified: Secondary | ICD-10-CM | POA: Diagnosis not present

## 2021-03-02 DIAGNOSIS — C4441 Basal cell carcinoma of skin of scalp and neck: Secondary | ICD-10-CM | POA: Diagnosis not present

## 2021-03-02 DIAGNOSIS — H401191 Primary open-angle glaucoma, unspecified eye, mild stage: Secondary | ICD-10-CM | POA: Diagnosis not present

## 2021-03-02 DIAGNOSIS — H401121 Primary open-angle glaucoma, left eye, mild stage: Secondary | ICD-10-CM | POA: Diagnosis not present

## 2021-03-02 DIAGNOSIS — H52223 Regular astigmatism, bilateral: Secondary | ICD-10-CM | POA: Diagnosis not present

## 2021-03-02 DIAGNOSIS — L820 Inflamed seborrheic keratosis: Secondary | ICD-10-CM | POA: Diagnosis not present

## 2021-03-11 DIAGNOSIS — Z23 Encounter for immunization: Secondary | ICD-10-CM | POA: Diagnosis not present

## 2021-03-24 DIAGNOSIS — C4441 Basal cell carcinoma of skin of scalp and neck: Secondary | ICD-10-CM | POA: Diagnosis not present

## 2021-03-24 DIAGNOSIS — D485 Neoplasm of uncertain behavior of skin: Secondary | ICD-10-CM | POA: Diagnosis not present

## 2021-03-24 DIAGNOSIS — C4922 Malignant neoplasm of connective and soft tissue of left lower limb, including hip: Secondary | ICD-10-CM | POA: Diagnosis not present

## 2021-03-30 DIAGNOSIS — Z85828 Personal history of other malignant neoplasm of skin: Secondary | ICD-10-CM | POA: Diagnosis not present

## 2021-03-30 DIAGNOSIS — L820 Inflamed seborrheic keratosis: Secondary | ICD-10-CM | POA: Diagnosis not present

## 2021-03-30 DIAGNOSIS — Z08 Encounter for follow-up examination after completed treatment for malignant neoplasm: Secondary | ICD-10-CM | POA: Diagnosis not present

## 2021-04-02 DIAGNOSIS — C499 Malignant neoplasm of connective and soft tissue, unspecified: Secondary | ICD-10-CM | POA: Diagnosis not present

## 2021-04-02 DIAGNOSIS — E041 Nontoxic single thyroid nodule: Secondary | ICD-10-CM | POA: Diagnosis not present

## 2021-04-02 DIAGNOSIS — I700 Atherosclerosis of aorta: Secondary | ICD-10-CM | POA: Diagnosis not present

## 2021-04-02 DIAGNOSIS — C4922 Malignant neoplasm of connective and soft tissue of left lower limb, including hip: Secondary | ICD-10-CM | POA: Diagnosis not present

## 2021-04-12 DIAGNOSIS — Z87891 Personal history of nicotine dependence: Secondary | ICD-10-CM | POA: Diagnosis not present

## 2021-04-12 DIAGNOSIS — I10 Essential (primary) hypertension: Secondary | ICD-10-CM | POA: Diagnosis not present

## 2021-04-12 DIAGNOSIS — Z91041 Radiographic dye allergy status: Secondary | ICD-10-CM | POA: Diagnosis not present

## 2021-04-12 DIAGNOSIS — E7800 Pure hypercholesterolemia, unspecified: Secondary | ICD-10-CM | POA: Diagnosis not present

## 2021-04-12 DIAGNOSIS — C4922 Malignant neoplasm of connective and soft tissue of left lower limb, including hip: Secondary | ICD-10-CM | POA: Diagnosis not present

## 2021-04-12 DIAGNOSIS — Z981 Arthrodesis status: Secondary | ICD-10-CM | POA: Diagnosis not present

## 2021-04-12 DIAGNOSIS — Z79899 Other long term (current) drug therapy: Secondary | ICD-10-CM | POA: Diagnosis not present

## 2021-04-12 DIAGNOSIS — C4441 Basal cell carcinoma of skin of scalp and neck: Secondary | ICD-10-CM | POA: Diagnosis not present

## 2021-04-21 DIAGNOSIS — C499 Malignant neoplasm of connective and soft tissue, unspecified: Secondary | ICD-10-CM | POA: Diagnosis not present

## 2021-04-21 DIAGNOSIS — Z09 Encounter for follow-up examination after completed treatment for conditions other than malignant neoplasm: Secondary | ICD-10-CM | POA: Diagnosis not present

## 2021-04-28 DIAGNOSIS — C499 Malignant neoplasm of connective and soft tissue, unspecified: Secondary | ICD-10-CM | POA: Diagnosis not present

## 2021-04-28 DIAGNOSIS — Z09 Encounter for follow-up examination after completed treatment for conditions other than malignant neoplasm: Secondary | ICD-10-CM | POA: Diagnosis not present

## 2021-05-20 DIAGNOSIS — C61 Malignant neoplasm of prostate: Secondary | ICD-10-CM | POA: Diagnosis not present

## 2021-05-27 DIAGNOSIS — C61 Malignant neoplasm of prostate: Secondary | ICD-10-CM | POA: Diagnosis not present

## 2021-07-06 DIAGNOSIS — S0512XS Contusion of eyeball and orbital tissues, left eye, sequela: Secondary | ICD-10-CM | POA: Diagnosis not present

## 2021-07-06 DIAGNOSIS — E7800 Pure hypercholesterolemia, unspecified: Secondary | ICD-10-CM | POA: Diagnosis not present

## 2021-07-06 DIAGNOSIS — Z9849 Cataract extraction status, unspecified eye: Secondary | ICD-10-CM | POA: Diagnosis not present

## 2021-07-06 DIAGNOSIS — H52223 Regular astigmatism, bilateral: Secondary | ICD-10-CM | POA: Diagnosis not present

## 2021-07-06 DIAGNOSIS — D1801 Hemangioma of skin and subcutaneous tissue: Secondary | ICD-10-CM | POA: Diagnosis not present

## 2021-07-06 DIAGNOSIS — I10 Essential (primary) hypertension: Secondary | ICD-10-CM | POA: Diagnosis not present

## 2021-07-06 DIAGNOSIS — H5203 Hypermetropia, bilateral: Secondary | ICD-10-CM | POA: Diagnosis not present

## 2021-07-06 DIAGNOSIS — H524 Presbyopia: Secondary | ICD-10-CM | POA: Diagnosis not present

## 2021-07-06 DIAGNOSIS — H401131 Primary open-angle glaucoma, bilateral, mild stage: Secondary | ICD-10-CM | POA: Diagnosis not present

## 2021-07-27 DIAGNOSIS — Z85828 Personal history of other malignant neoplasm of skin: Secondary | ICD-10-CM | POA: Diagnosis not present

## 2021-07-27 DIAGNOSIS — Z08 Encounter for follow-up examination after completed treatment for malignant neoplasm: Secondary | ICD-10-CM | POA: Diagnosis not present

## 2021-08-16 DIAGNOSIS — E538 Deficiency of other specified B group vitamins: Secondary | ICD-10-CM | POA: Diagnosis not present

## 2021-08-16 DIAGNOSIS — Z0000 Encounter for general adult medical examination without abnormal findings: Secondary | ICD-10-CM | POA: Diagnosis not present

## 2021-08-16 DIAGNOSIS — E041 Nontoxic single thyroid nodule: Secondary | ICD-10-CM | POA: Diagnosis not present

## 2021-08-16 DIAGNOSIS — E782 Mixed hyperlipidemia: Secondary | ICD-10-CM | POA: Diagnosis not present

## 2021-08-16 DIAGNOSIS — I10 Essential (primary) hypertension: Secondary | ICD-10-CM | POA: Diagnosis not present

## 2021-08-16 DIAGNOSIS — Z1389 Encounter for screening for other disorder: Secondary | ICD-10-CM | POA: Diagnosis not present

## 2021-09-10 DIAGNOSIS — I10 Essential (primary) hypertension: Secondary | ICD-10-CM | POA: Diagnosis not present

## 2021-09-10 DIAGNOSIS — E782 Mixed hyperlipidemia: Secondary | ICD-10-CM | POA: Diagnosis not present

## 2021-09-10 DIAGNOSIS — H401194 Primary open-angle glaucoma, unspecified eye, indeterminate stage: Secondary | ICD-10-CM | POA: Diagnosis not present

## 2021-09-10 DIAGNOSIS — E041 Nontoxic single thyroid nodule: Secondary | ICD-10-CM | POA: Diagnosis not present

## 2021-09-10 DIAGNOSIS — E538 Deficiency of other specified B group vitamins: Secondary | ICD-10-CM | POA: Diagnosis not present

## 2021-09-10 DIAGNOSIS — R7301 Impaired fasting glucose: Secondary | ICD-10-CM | POA: Diagnosis not present

## 2021-11-01 DIAGNOSIS — C499 Malignant neoplasm of connective and soft tissue, unspecified: Secondary | ICD-10-CM | POA: Diagnosis not present

## 2021-11-01 DIAGNOSIS — Z6830 Body mass index (BMI) 30.0-30.9, adult: Secondary | ICD-10-CM | POA: Diagnosis not present

## 2021-11-17 DIAGNOSIS — C61 Malignant neoplasm of prostate: Secondary | ICD-10-CM | POA: Diagnosis not present

## 2021-11-24 DIAGNOSIS — R3121 Asymptomatic microscopic hematuria: Secondary | ICD-10-CM | POA: Diagnosis not present

## 2021-11-24 DIAGNOSIS — C61 Malignant neoplasm of prostate: Secondary | ICD-10-CM | POA: Diagnosis not present

## 2021-12-08 DIAGNOSIS — R319 Hematuria, unspecified: Secondary | ICD-10-CM | POA: Diagnosis not present

## 2021-12-08 DIAGNOSIS — R3121 Asymptomatic microscopic hematuria: Secondary | ICD-10-CM | POA: Diagnosis not present

## 2021-12-23 DIAGNOSIS — R3121 Asymptomatic microscopic hematuria: Secondary | ICD-10-CM | POA: Diagnosis not present

## 2022-01-03 DIAGNOSIS — H4010X2 Unspecified open-angle glaucoma, moderate stage: Secondary | ICD-10-CM | POA: Diagnosis not present

## 2022-01-03 DIAGNOSIS — H401132 Primary open-angle glaucoma, bilateral, moderate stage: Secondary | ICD-10-CM | POA: Diagnosis not present

## 2022-01-05 ENCOUNTER — Other Ambulatory Visit: Payer: Self-pay | Admitting: Ophthalmology

## 2022-01-05 DIAGNOSIS — N3289 Other specified disorders of bladder: Secondary | ICD-10-CM | POA: Diagnosis not present

## 2022-01-05 DIAGNOSIS — D303 Benign neoplasm of bladder: Secondary | ICD-10-CM | POA: Diagnosis not present

## 2022-01-05 DIAGNOSIS — D414 Neoplasm of uncertain behavior of bladder: Secondary | ICD-10-CM | POA: Diagnosis not present

## 2022-01-13 DIAGNOSIS — R3121 Asymptomatic microscopic hematuria: Secondary | ICD-10-CM | POA: Diagnosis not present

## 2022-01-13 DIAGNOSIS — D414 Neoplasm of uncertain behavior of bladder: Secondary | ICD-10-CM | POA: Diagnosis not present

## 2022-01-13 DIAGNOSIS — C61 Malignant neoplasm of prostate: Secondary | ICD-10-CM | POA: Diagnosis not present

## 2022-01-25 DIAGNOSIS — Z85828 Personal history of other malignant neoplasm of skin: Secondary | ICD-10-CM | POA: Diagnosis not present

## 2022-01-25 DIAGNOSIS — B078 Other viral warts: Secondary | ICD-10-CM | POA: Diagnosis not present

## 2022-01-25 DIAGNOSIS — Z08 Encounter for follow-up examination after completed treatment for malignant neoplasm: Secondary | ICD-10-CM | POA: Diagnosis not present

## 2022-02-17 DIAGNOSIS — Z23 Encounter for immunization: Secondary | ICD-10-CM | POA: Diagnosis not present

## 2022-02-17 DIAGNOSIS — E782 Mixed hyperlipidemia: Secondary | ICD-10-CM | POA: Diagnosis not present

## 2022-02-17 DIAGNOSIS — Z6831 Body mass index (BMI) 31.0-31.9, adult: Secondary | ICD-10-CM | POA: Diagnosis not present

## 2022-02-17 DIAGNOSIS — I10 Essential (primary) hypertension: Secondary | ICD-10-CM | POA: Diagnosis not present

## 2022-03-29 DIAGNOSIS — L570 Actinic keratosis: Secondary | ICD-10-CM | POA: Diagnosis not present

## 2022-03-29 DIAGNOSIS — L820 Inflamed seborrheic keratosis: Secondary | ICD-10-CM | POA: Diagnosis not present

## 2022-03-29 DIAGNOSIS — X32XXXD Exposure to sunlight, subsequent encounter: Secondary | ICD-10-CM | POA: Diagnosis not present

## 2022-04-05 DIAGNOSIS — H5203 Hypermetropia, bilateral: Secondary | ICD-10-CM | POA: Diagnosis not present

## 2022-04-05 DIAGNOSIS — H52223 Regular astigmatism, bilateral: Secondary | ICD-10-CM | POA: Diagnosis not present

## 2022-04-05 DIAGNOSIS — H524 Presbyopia: Secondary | ICD-10-CM | POA: Diagnosis not present

## 2022-04-05 DIAGNOSIS — H401111 Primary open-angle glaucoma, right eye, mild stage: Secondary | ICD-10-CM | POA: Diagnosis not present

## 2022-04-05 DIAGNOSIS — H401121 Primary open-angle glaucoma, left eye, mild stage: Secondary | ICD-10-CM | POA: Diagnosis not present

## 2022-04-27 DIAGNOSIS — D4989 Neoplasm of unspecified behavior of other specified sites: Secondary | ICD-10-CM | POA: Diagnosis not present

## 2022-05-19 DIAGNOSIS — C61 Malignant neoplasm of prostate: Secondary | ICD-10-CM | POA: Diagnosis not present

## 2022-05-26 DIAGNOSIS — C61 Malignant neoplasm of prostate: Secondary | ICD-10-CM | POA: Diagnosis not present

## 2022-05-26 DIAGNOSIS — R3121 Asymptomatic microscopic hematuria: Secondary | ICD-10-CM | POA: Diagnosis not present

## 2022-06-21 DIAGNOSIS — M47896 Other spondylosis, lumbar region: Secondary | ICD-10-CM | POA: Diagnosis not present

## 2022-06-21 DIAGNOSIS — H401194 Primary open-angle glaucoma, unspecified eye, indeterminate stage: Secondary | ICD-10-CM | POA: Diagnosis not present

## 2022-06-21 DIAGNOSIS — E782 Mixed hyperlipidemia: Secondary | ICD-10-CM | POA: Diagnosis not present

## 2022-06-21 DIAGNOSIS — Z8546 Personal history of malignant neoplasm of prostate: Secondary | ICD-10-CM | POA: Diagnosis not present

## 2022-06-21 DIAGNOSIS — I10 Essential (primary) hypertension: Secondary | ICD-10-CM | POA: Diagnosis not present

## 2022-06-21 DIAGNOSIS — E538 Deficiency of other specified B group vitamins: Secondary | ICD-10-CM | POA: Diagnosis not present

## 2022-06-21 DIAGNOSIS — Z6832 Body mass index (BMI) 32.0-32.9, adult: Secondary | ICD-10-CM | POA: Diagnosis not present

## 2022-06-30 DIAGNOSIS — H401132 Primary open-angle glaucoma, bilateral, moderate stage: Secondary | ICD-10-CM | POA: Diagnosis not present

## 2022-07-26 DIAGNOSIS — Z1283 Encounter for screening for malignant neoplasm of skin: Secondary | ICD-10-CM | POA: Diagnosis not present

## 2022-07-26 DIAGNOSIS — L570 Actinic keratosis: Secondary | ICD-10-CM | POA: Diagnosis not present

## 2022-07-26 DIAGNOSIS — X32XXXD Exposure to sunlight, subsequent encounter: Secondary | ICD-10-CM | POA: Diagnosis not present

## 2022-07-26 DIAGNOSIS — D225 Melanocytic nevi of trunk: Secondary | ICD-10-CM | POA: Diagnosis not present

## 2022-08-22 DIAGNOSIS — E782 Mixed hyperlipidemia: Secondary | ICD-10-CM | POA: Diagnosis not present

## 2022-08-22 DIAGNOSIS — R7301 Impaired fasting glucose: Secondary | ICD-10-CM | POA: Diagnosis not present

## 2022-08-22 DIAGNOSIS — I10 Essential (primary) hypertension: Secondary | ICD-10-CM | POA: Diagnosis not present

## 2022-08-22 DIAGNOSIS — E041 Nontoxic single thyroid nodule: Secondary | ICD-10-CM | POA: Diagnosis not present

## 2022-08-22 DIAGNOSIS — E538 Deficiency of other specified B group vitamins: Secondary | ICD-10-CM | POA: Diagnosis not present

## 2022-09-02 DIAGNOSIS — R600 Localized edema: Secondary | ICD-10-CM | POA: Diagnosis not present

## 2022-09-02 DIAGNOSIS — M5136 Other intervertebral disc degeneration, lumbar region: Secondary | ICD-10-CM | POA: Diagnosis not present

## 2022-09-02 DIAGNOSIS — Z6830 Body mass index (BMI) 30.0-30.9, adult: Secondary | ICD-10-CM | POA: Diagnosis not present

## 2022-09-02 DIAGNOSIS — Z23 Encounter for immunization: Secondary | ICD-10-CM | POA: Diagnosis not present

## 2022-09-02 DIAGNOSIS — Z0000 Encounter for general adult medical examination without abnormal findings: Secondary | ICD-10-CM | POA: Diagnosis not present

## 2022-09-28 DIAGNOSIS — M5136 Other intervertebral disc degeneration, lumbar region: Secondary | ICD-10-CM | POA: Diagnosis not present

## 2022-11-10 DIAGNOSIS — L608 Other nail disorders: Secondary | ICD-10-CM | POA: Diagnosis not present

## 2022-11-10 DIAGNOSIS — L03039 Cellulitis of unspecified toe: Secondary | ICD-10-CM | POA: Diagnosis not present

## 2022-11-16 DIAGNOSIS — C61 Malignant neoplasm of prostate: Secondary | ICD-10-CM | POA: Diagnosis not present

## 2022-11-24 DIAGNOSIS — R3915 Urgency of urination: Secondary | ICD-10-CM | POA: Diagnosis not present

## 2022-11-24 DIAGNOSIS — R3912 Poor urinary stream: Secondary | ICD-10-CM | POA: Diagnosis not present

## 2022-11-24 DIAGNOSIS — C61 Malignant neoplasm of prostate: Secondary | ICD-10-CM | POA: Diagnosis not present

## 2022-12-05 ENCOUNTER — Encounter: Payer: Self-pay | Admitting: Podiatry

## 2022-12-05 ENCOUNTER — Ambulatory Visit (INDEPENDENT_AMBULATORY_CARE_PROVIDER_SITE_OTHER): Payer: Medicare Other | Admitting: Podiatry

## 2022-12-05 DIAGNOSIS — G629 Polyneuropathy, unspecified: Secondary | ICD-10-CM | POA: Diagnosis not present

## 2022-12-05 DIAGNOSIS — B351 Tinea unguium: Secondary | ICD-10-CM

## 2022-12-05 DIAGNOSIS — M79675 Pain in left toe(s): Secondary | ICD-10-CM | POA: Diagnosis not present

## 2022-12-05 DIAGNOSIS — M79674 Pain in right toe(s): Secondary | ICD-10-CM | POA: Diagnosis not present

## 2022-12-05 DIAGNOSIS — S91209A Unspecified open wound of unspecified toe(s) with damage to nail, initial encounter: Secondary | ICD-10-CM

## 2022-12-05 NOTE — Progress Notes (Signed)
  Subjective:  Patient ID: Jose Schmitt, male    DOB: 1941/04/15,   MRN: [id number]  Chief Complaint  Patient presents with   Nail Problem    Patient came in today for nail fungus, patient's right hallux has come off, patient denies any pain, nails are thick and long,     82 y.o. male presents for concern of right great toe as above. He was seen by PCP and advised to follow-up. Also concern of thickened elongated and painful nails that are difficult to trim. Requesting to have them trimmed today. He has a history of back surgery and numbness in his feet. Unable to trim nails himself.   PCP:  Orpha Bur, MD    . Denies any other pedal complaints. Denies n/v/f/c.   Past Medical History:  Diagnosis Date   Arthritis    Chronic back pain    DDD and stenosis   GERD (gastroesophageal reflux disease)    takes Zantac daily   History of IBS    Hx of seasonal allergies    Hyperlipidemia    takes Zocor nightly   Hypertension    takes Lisinopril,Atenolol,Hctz daily   Impaired hearing    right ear   Kidney stones    hx of    Prostate cancer (HCC)    Thyroid nodule     Objective:  Physical Exam: Vascular: DP/PT pulses 2/4 bilateral. CFT <3 seconds. Absent hair growth on digits. Edema noted to bilateral lower extremities. Xerosis noted bilaterally.  Skin. No lacerations or abrasions bilateral feet. Nails 1-5 bilateral  are thickened discolored and elongated with subungual debris. Right hallux nail absent with underlying nail bed appearing healthy   Musculoskeletal: MMT 5/5 bilateral lower extremities in DF, PF, Inversion and Eversion. Deceased ROM in DF of ankle joint.  Neurological: Sensation intact to light touch. Protective sensation diminished bilateral.    Assessment:   1. Avulsion of toenail of right foot   2. Pain due to onychomycosis of toenails of both feet   3. Peripheral polyneuropathy      Plan:  Patient was evaluated and treated and all questions  answered. -Right hallux nail bed appears healthy with no nail growing back as of right now will continue to monitor.  -Mechanically debrided all nails 1-5 bilateral using sterile nail nipper and filed with dremel without incident as courtesy.  -Answered all patient questions -Patient to return  in 6 months for at risk foot care -Patient advised to call the office if any problems or questions arise in the meantime.   Louann Sjogren, DPM

## 2022-12-08 DIAGNOSIS — H5203 Hypermetropia, bilateral: Secondary | ICD-10-CM | POA: Diagnosis not present

## 2022-12-08 DIAGNOSIS — E7800 Pure hypercholesterolemia, unspecified: Secondary | ICD-10-CM | POA: Diagnosis not present

## 2022-12-08 DIAGNOSIS — H401132 Primary open-angle glaucoma, bilateral, moderate stage: Secondary | ICD-10-CM | POA: Diagnosis not present

## 2022-12-08 DIAGNOSIS — H524 Presbyopia: Secondary | ICD-10-CM | POA: Diagnosis not present

## 2022-12-08 DIAGNOSIS — I10 Essential (primary) hypertension: Secondary | ICD-10-CM | POA: Diagnosis not present

## 2022-12-08 DIAGNOSIS — H52223 Regular astigmatism, bilateral: Secondary | ICD-10-CM | POA: Diagnosis not present

## 2023-01-24 DIAGNOSIS — Z08 Encounter for follow-up examination after completed treatment for malignant neoplasm: Secondary | ICD-10-CM | POA: Diagnosis not present

## 2023-01-24 DIAGNOSIS — X32XXXD Exposure to sunlight, subsequent encounter: Secondary | ICD-10-CM | POA: Diagnosis not present

## 2023-01-24 DIAGNOSIS — D225 Melanocytic nevi of trunk: Secondary | ICD-10-CM | POA: Diagnosis not present

## 2023-01-24 DIAGNOSIS — Z1283 Encounter for screening for malignant neoplasm of skin: Secondary | ICD-10-CM | POA: Diagnosis not present

## 2023-01-24 DIAGNOSIS — L570 Actinic keratosis: Secondary | ICD-10-CM | POA: Diagnosis not present

## 2023-01-24 DIAGNOSIS — Z85828 Personal history of other malignant neoplasm of skin: Secondary | ICD-10-CM | POA: Diagnosis not present

## 2023-03-08 DIAGNOSIS — Z23 Encounter for immunization: Secondary | ICD-10-CM | POA: Diagnosis not present

## 2023-03-08 DIAGNOSIS — E782 Mixed hyperlipidemia: Secondary | ICD-10-CM | POA: Diagnosis not present

## 2023-03-08 DIAGNOSIS — E669 Obesity, unspecified: Secondary | ICD-10-CM | POA: Diagnosis not present

## 2023-03-08 DIAGNOSIS — I10 Essential (primary) hypertension: Secondary | ICD-10-CM | POA: Diagnosis not present

## 2023-04-28 DIAGNOSIS — R058 Other specified cough: Secondary | ICD-10-CM | POA: Diagnosis not present

## 2023-04-28 DIAGNOSIS — R051 Acute cough: Secondary | ICD-10-CM | POA: Diagnosis not present

## 2023-05-24 DIAGNOSIS — D4989 Neoplasm of unspecified behavior of other specified sites: Secondary | ICD-10-CM | POA: Diagnosis not present

## 2023-06-07 ENCOUNTER — Encounter: Payer: Self-pay | Admitting: Podiatry

## 2023-06-07 ENCOUNTER — Ambulatory Visit (INDEPENDENT_AMBULATORY_CARE_PROVIDER_SITE_OTHER): Payer: Medicare Other | Admitting: Podiatry

## 2023-06-07 DIAGNOSIS — M79675 Pain in left toe(s): Secondary | ICD-10-CM

## 2023-06-07 DIAGNOSIS — B351 Tinea unguium: Secondary | ICD-10-CM | POA: Diagnosis not present

## 2023-06-07 DIAGNOSIS — M79674 Pain in right toe(s): Secondary | ICD-10-CM | POA: Diagnosis not present

## 2023-06-07 DIAGNOSIS — G629 Polyneuropathy, unspecified: Secondary | ICD-10-CM

## 2023-06-07 NOTE — Progress Notes (Signed)
  Subjective:  Patient ID: Jose Schmitt, male    DOB: Oct 23, 1940,   MRN: [id number]  Chief Complaint  Patient presents with   Nail Problem    Rfc.    83 y.o. male presents for concern of thickened elongated and painful nails that are difficult to trim. Requesting to have them trimmed today. He has a history of back surgery and numbness in his feet. Unable to trim nails himself. Also relates some concern for discoloration of left great toenail and a rash that developed on his foot that has since resolved.   PCP:  Orpha Bur, MD    . Denies any other pedal complaints. Denies n/v/f/c.   Past Medical History:  Diagnosis Date   Arthritis    Chronic back pain    DDD and stenosis   GERD (gastroesophageal reflux disease)    takes Zantac daily   History of IBS    Hx of seasonal allergies    Hyperlipidemia    takes Zocor nightly   Hypertension    takes Lisinopril,Atenolol,Hctz daily   Impaired hearing    right ear   Kidney stones    hx of    Prostate cancer (HCC)    Thyroid nodule     Objective:  Physical Exam: Vascular: DP/PT pulses 2/4 bilateral. CFT <3 seconds. Absent hair growth on digits. Edema noted to bilateral lower extremities. Xerosis noted bilaterally.  Skin. No lacerations or abrasions bilateral feet. Nails 1-5 bilateral  are thickened discolored and elongated with subungual debris. Left hallux nail with eccymosis underlying. Right hallux nail growing well.  Musculoskeletal: MMT 5/5 bilateral lower extremities in DF, PF, Inversion and Eversion. Deceased ROM in DF of ankle joint.  Neurological: Sensation intact to light touch. Protective sensation diminished bilateral.    Assessment:   1. Pain due to onychomycosis of toenails of both feet   2. Peripheral polyneuropathy      Plan:  Patient was evaluated and treated and all questions answered. Discussed some concerned with discoloration of the left great toe and also some concern for a rash that has since  resolved. Discussed possible etiologies and treatments.  -ABN signed  -Mechanically debrided all nails 1-5 bilateral using sterile nail nipper and filed with dremel without incident.  -Answered all patient questions -Patient to return  in 6 months for at risk foot care -Patient advised to call the office if any problems or questions arise in the meantime.   Louann Sjogren, DPM

## 2023-06-08 DIAGNOSIS — H401132 Primary open-angle glaucoma, bilateral, moderate stage: Secondary | ICD-10-CM | POA: Diagnosis not present

## 2023-07-25 DIAGNOSIS — X32XXXD Exposure to sunlight, subsequent encounter: Secondary | ICD-10-CM | POA: Diagnosis not present

## 2023-07-25 DIAGNOSIS — L570 Actinic keratosis: Secondary | ICD-10-CM | POA: Diagnosis not present

## 2023-07-25 DIAGNOSIS — C499 Malignant neoplasm of connective and soft tissue, unspecified: Secondary | ICD-10-CM | POA: Diagnosis not present

## 2023-08-08 DIAGNOSIS — I10 Essential (primary) hypertension: Secondary | ICD-10-CM | POA: Diagnosis not present

## 2023-08-14 DIAGNOSIS — I10 Essential (primary) hypertension: Secondary | ICD-10-CM | POA: Diagnosis not present

## 2023-08-14 DIAGNOSIS — E782 Mixed hyperlipidemia: Secondary | ICD-10-CM | POA: Diagnosis not present

## 2023-08-14 DIAGNOSIS — E669 Obesity, unspecified: Secondary | ICD-10-CM | POA: Diagnosis not present

## 2023-09-06 ENCOUNTER — Encounter: Payer: Self-pay | Admitting: Podiatry

## 2023-09-06 ENCOUNTER — Ambulatory Visit (INDEPENDENT_AMBULATORY_CARE_PROVIDER_SITE_OTHER): Payer: Medicare Other | Admitting: Podiatry

## 2023-09-06 DIAGNOSIS — M79675 Pain in left toe(s): Secondary | ICD-10-CM

## 2023-09-06 DIAGNOSIS — G629 Polyneuropathy, unspecified: Secondary | ICD-10-CM

## 2023-09-06 DIAGNOSIS — B351 Tinea unguium: Secondary | ICD-10-CM | POA: Diagnosis not present

## 2023-09-06 DIAGNOSIS — I10 Essential (primary) hypertension: Secondary | ICD-10-CM | POA: Diagnosis not present

## 2023-09-06 DIAGNOSIS — M79674 Pain in right toe(s): Secondary | ICD-10-CM | POA: Diagnosis not present

## 2023-09-06 NOTE — Progress Notes (Signed)
  Subjective:  Patient ID: Jose Schmitt, male    DOB: [birth year],   MRN: [id number]  No chief complaint on file.   83 y.o. male presents for concern of thickened elongated and painful nails that are difficult to trim. Requesting to have them trimmed today. He has a history of back surgery and numbness in his feet. Unable to trim nails himself. Also relates some concern for discoloration of left great toenail and a rash that developed on his foot that has since resolved.   PCP:  Arlys Berke, MD    . Denies any other pedal complaints. Denies n/v/f/c.   Past Medical History:  Diagnosis Date   Arthritis    Chronic back pain    DDD and stenosis   GERD (gastroesophageal reflux disease)    takes Zantac daily   History of IBS    Hx of seasonal allergies    Hyperlipidemia    takes Zocor nightly   Hypertension    takes Lisinopril ,Atenolol ,Hctz daily   Impaired hearing    right ear   Kidney stones    hx of    Prostate cancer (HCC)    Thyroid  nodule     Objective:  Physical Exam: Vascular: DP/PT pulses 2/4 bilateral. CFT <3 seconds. Absent hair growth on digits. Edema noted to bilateral lower extremities. Xerosis noted bilaterally.  Skin. No lacerations or abrasions bilateral feet. Nails 1-5 bilateral  are thickened discolored and elongated with subungual debris. Left hallux nail with eccymosis underlying. Right hallux nail growing well.  Musculoskeletal: MMT 5/5 bilateral lower extremities in DF, PF, Inversion and Eversion. Deceased ROM in DF of ankle joint.  Neurological: Sensation intact to light touch. Protective sensation diminished bilateral.    Assessment:   1. Pain due to onychomycosis of toenails of both feet   2. Peripheral polyneuropathy      Plan:  Patient was evaluated and treated and all questions answered. Discussed some concerned with discoloration of the left great toe and also some concern for a rash that has since resolved. Discussed possible etiologies and  treatments.  -ABN signed previous visit  -Mechanically debrided all nails 1-5 bilateral using sterile nail nipper and filed with dremel without incident.  -Answered all patient questions -Patient to return  in 6 months for at risk foot care -Patient advised to call the office if any problems or questions arise in the meantime.   Jennefer Moats, DPM

## 2023-09-07 DIAGNOSIS — H401132 Primary open-angle glaucoma, bilateral, moderate stage: Secondary | ICD-10-CM | POA: Diagnosis not present

## 2023-09-13 DIAGNOSIS — E669 Obesity, unspecified: Secondary | ICD-10-CM | POA: Diagnosis not present

## 2023-09-13 DIAGNOSIS — I10 Essential (primary) hypertension: Secondary | ICD-10-CM | POA: Diagnosis not present

## 2023-09-13 DIAGNOSIS — E782 Mixed hyperlipidemia: Secondary | ICD-10-CM | POA: Diagnosis not present

## 2023-09-15 DIAGNOSIS — R600 Localized edema: Secondary | ICD-10-CM | POA: Diagnosis not present

## 2023-09-15 DIAGNOSIS — Z6831 Body mass index (BMI) 31.0-31.9, adult: Secondary | ICD-10-CM | POA: Diagnosis not present

## 2023-09-15 DIAGNOSIS — E042 Nontoxic multinodular goiter: Secondary | ICD-10-CM | POA: Diagnosis not present

## 2023-09-15 DIAGNOSIS — Z0000 Encounter for general adult medical examination without abnormal findings: Secondary | ICD-10-CM | POA: Diagnosis not present

## 2023-09-15 DIAGNOSIS — E782 Mixed hyperlipidemia: Secondary | ICD-10-CM | POA: Diagnosis not present

## 2023-09-15 DIAGNOSIS — E538 Deficiency of other specified B group vitamins: Secondary | ICD-10-CM | POA: Diagnosis not present

## 2023-09-15 DIAGNOSIS — I10 Essential (primary) hypertension: Secondary | ICD-10-CM | POA: Diagnosis not present

## 2023-10-06 DIAGNOSIS — I10 Essential (primary) hypertension: Secondary | ICD-10-CM | POA: Diagnosis not present

## 2023-10-11 DIAGNOSIS — R600 Localized edema: Secondary | ICD-10-CM | POA: Diagnosis not present

## 2023-10-13 DIAGNOSIS — I10 Essential (primary) hypertension: Secondary | ICD-10-CM | POA: Diagnosis not present

## 2023-10-13 DIAGNOSIS — R600 Localized edema: Secondary | ICD-10-CM | POA: Diagnosis not present

## 2023-10-13 DIAGNOSIS — Z6830 Body mass index (BMI) 30.0-30.9, adult: Secondary | ICD-10-CM | POA: Diagnosis not present

## 2023-10-14 DIAGNOSIS — E669 Obesity, unspecified: Secondary | ICD-10-CM | POA: Diagnosis not present

## 2023-10-14 DIAGNOSIS — E782 Mixed hyperlipidemia: Secondary | ICD-10-CM | POA: Diagnosis not present

## 2023-10-14 DIAGNOSIS — I10 Essential (primary) hypertension: Secondary | ICD-10-CM | POA: Diagnosis not present

## 2023-10-31 DIAGNOSIS — L820 Inflamed seborrheic keratosis: Secondary | ICD-10-CM | POA: Diagnosis not present

## 2023-10-31 DIAGNOSIS — D225 Melanocytic nevi of trunk: Secondary | ICD-10-CM | POA: Diagnosis not present

## 2023-11-05 DIAGNOSIS — I10 Essential (primary) hypertension: Secondary | ICD-10-CM | POA: Diagnosis not present

## 2023-11-21 DIAGNOSIS — C61 Malignant neoplasm of prostate: Secondary | ICD-10-CM | POA: Diagnosis not present

## 2023-11-28 DIAGNOSIS — C61 Malignant neoplasm of prostate: Secondary | ICD-10-CM | POA: Diagnosis not present

## 2023-11-28 DIAGNOSIS — N5201 Erectile dysfunction due to arterial insufficiency: Secondary | ICD-10-CM | POA: Diagnosis not present

## 2023-12-05 DIAGNOSIS — I10 Essential (primary) hypertension: Secondary | ICD-10-CM | POA: Diagnosis not present

## 2023-12-06 ENCOUNTER — Encounter: Payer: Self-pay | Admitting: Podiatry

## 2023-12-06 ENCOUNTER — Ambulatory Visit (INDEPENDENT_AMBULATORY_CARE_PROVIDER_SITE_OTHER): Admitting: Podiatry

## 2023-12-06 DIAGNOSIS — M79674 Pain in right toe(s): Secondary | ICD-10-CM | POA: Diagnosis not present

## 2023-12-06 DIAGNOSIS — M79675 Pain in left toe(s): Secondary | ICD-10-CM | POA: Diagnosis not present

## 2023-12-06 DIAGNOSIS — B351 Tinea unguium: Secondary | ICD-10-CM | POA: Diagnosis not present

## 2023-12-06 DIAGNOSIS — G629 Polyneuropathy, unspecified: Secondary | ICD-10-CM | POA: Diagnosis not present

## 2023-12-06 NOTE — Progress Notes (Signed)
  Subjective:  Patient ID: Jose Schmitt, male    DOB: 01/06/1941,   MRN: [id number]  Chief Complaint  Patient presents with   Nail Problem    The right big toe is finally coming back and watching the Left big toe     83 y.o. male presents for concern of thickened elongated and painful nails that are difficult to trim. Requesting to have them trimmed today. He has a history of back surgery and numbness in his feet. Unable to trim nails himself. Also relates some concern for discoloration of left great toenail and a rash that developed on his foot that has since resolved.   PCP:  Delayne Artist PARAS, MD    . Denies any other pedal complaints. Denies n/v/f/c.   Past Medical History:  Diagnosis Date   Arthritis    Chronic back pain    DDD and stenosis   GERD (gastroesophageal reflux disease)    takes Zantac daily   History of IBS    Hx of seasonal allergies    Hyperlipidemia    takes Zocor nightly   Hypertension    takes Lisinopril ,Atenolol ,Hctz daily   Impaired hearing    right ear   Kidney stones    hx of    Prostate cancer (HCC)    Thyroid  nodule     Objective:  Physical Exam: Vascular: DP/PT pulses 2/4 bilateral. CFT <3 seconds. Absent hair growth on digits. Edema noted to bilateral lower extremities. Xerosis noted bilaterally.  Skin. No lacerations or abrasions bilateral feet. Nails 1-5 bilateral  are thickened discolored and elongated with subungual debris. Left hallux nail with eccymosis underlying. Right hallux nail growing well.  Musculoskeletal: MMT 5/5 bilateral lower extremities in DF, PF, Inversion and Eversion. Deceased ROM in DF of ankle joint.  Neurological: Sensation intact to light touch. Protective sensation diminished bilateral.    Assessment:   1. Pain due to onychomycosis of toenails of both feet   2. Peripheral polyneuropathy      Plan:  Patient was evaluated and treated and all questions answered. Discussed some concerned with discoloration of the  left great toe and also some concern for a rash that has since resolved. Discussed possible etiologies and treatments.  -ABN signed previous visit  -Mechanically debrided all nails 1-5 bilateral using sterile nail nipper and filed with dremel without incident.  -Answered all patient questions -Patient to return  in 6 months for at risk foot care -Patient advised to call the office if any problems or questions arise in the meantime.   [name], DPM

## 2023-12-12 DIAGNOSIS — Z981 Arthrodesis status: Secondary | ICD-10-CM | POA: Diagnosis not present

## 2023-12-12 DIAGNOSIS — G629 Polyneuropathy, unspecified: Secondary | ICD-10-CM | POA: Diagnosis not present

## 2023-12-14 DIAGNOSIS — H33302 Unspecified retinal break, left eye: Secondary | ICD-10-CM | POA: Diagnosis not present

## 2023-12-14 DIAGNOSIS — E782 Mixed hyperlipidemia: Secondary | ICD-10-CM | POA: Diagnosis not present

## 2023-12-14 DIAGNOSIS — H53143 Visual discomfort, bilateral: Secondary | ICD-10-CM | POA: Diagnosis not present

## 2023-12-14 DIAGNOSIS — H401132 Primary open-angle glaucoma, bilateral, moderate stage: Secondary | ICD-10-CM | POA: Diagnosis not present

## 2023-12-14 DIAGNOSIS — H3540 Unspecified peripheral retinal degeneration: Secondary | ICD-10-CM | POA: Diagnosis not present

## 2023-12-14 DIAGNOSIS — E669 Obesity, unspecified: Secondary | ICD-10-CM | POA: Diagnosis not present

## 2023-12-14 DIAGNOSIS — I10 Essential (primary) hypertension: Secondary | ICD-10-CM | POA: Diagnosis not present

## 2023-12-17 DIAGNOSIS — S52501A Unspecified fracture of the lower end of right radius, initial encounter for closed fracture: Secondary | ICD-10-CM | POA: Diagnosis not present

## 2023-12-17 DIAGNOSIS — S6991XA Unspecified injury of right wrist, hand and finger(s), initial encounter: Secondary | ICD-10-CM | POA: Diagnosis not present

## 2023-12-18 DIAGNOSIS — M25531 Pain in right wrist: Secondary | ICD-10-CM | POA: Diagnosis not present

## 2023-12-20 DIAGNOSIS — S52501A Unspecified fracture of the lower end of right radius, initial encounter for closed fracture: Secondary | ICD-10-CM | POA: Diagnosis not present

## 2023-12-20 DIAGNOSIS — S52614A Nondisplaced fracture of right ulna styloid process, initial encounter for closed fracture: Secondary | ICD-10-CM | POA: Diagnosis not present

## 2024-01-04 DIAGNOSIS — I10 Essential (primary) hypertension: Secondary | ICD-10-CM | POA: Diagnosis not present

## 2024-01-14 DIAGNOSIS — E782 Mixed hyperlipidemia: Secondary | ICD-10-CM | POA: Diagnosis not present

## 2024-01-14 DIAGNOSIS — I10 Essential (primary) hypertension: Secondary | ICD-10-CM | POA: Diagnosis not present

## 2024-01-14 DIAGNOSIS — E669 Obesity, unspecified: Secondary | ICD-10-CM | POA: Diagnosis not present

## 2024-01-17 DIAGNOSIS — S52501A Unspecified fracture of the lower end of right radius, initial encounter for closed fracture: Secondary | ICD-10-CM | POA: Diagnosis not present

## 2024-01-17 DIAGNOSIS — S52614A Nondisplaced fracture of right ulna styloid process, initial encounter for closed fracture: Secondary | ICD-10-CM | POA: Diagnosis not present

## 2024-01-23 DIAGNOSIS — X32XXXD Exposure to sunlight, subsequent encounter: Secondary | ICD-10-CM | POA: Diagnosis not present

## 2024-01-23 DIAGNOSIS — D225 Melanocytic nevi of trunk: Secondary | ICD-10-CM | POA: Diagnosis not present

## 2024-01-23 DIAGNOSIS — Z1283 Encounter for screening for malignant neoplasm of skin: Secondary | ICD-10-CM | POA: Diagnosis not present

## 2024-01-23 DIAGNOSIS — L821 Other seborrheic keratosis: Secondary | ICD-10-CM | POA: Diagnosis not present

## 2024-01-23 DIAGNOSIS — L570 Actinic keratosis: Secondary | ICD-10-CM | POA: Diagnosis not present

## 2024-01-26 DIAGNOSIS — M25641 Stiffness of right hand, not elsewhere classified: Secondary | ICD-10-CM | POA: Diagnosis not present

## 2024-01-30 DIAGNOSIS — H401194 Primary open-angle glaucoma, unspecified eye, indeterminate stage: Secondary | ICD-10-CM | POA: Diagnosis not present

## 2024-01-30 DIAGNOSIS — Z23 Encounter for immunization: Secondary | ICD-10-CM | POA: Diagnosis not present

## 2024-01-30 DIAGNOSIS — E782 Mixed hyperlipidemia: Secondary | ICD-10-CM | POA: Diagnosis not present

## 2024-01-30 DIAGNOSIS — I10 Essential (primary) hypertension: Secondary | ICD-10-CM | POA: Diagnosis not present

## 2024-02-01 DIAGNOSIS — H401132 Primary open-angle glaucoma, bilateral, moderate stage: Secondary | ICD-10-CM | POA: Diagnosis not present

## 2024-02-02 DIAGNOSIS — M25631 Stiffness of right wrist, not elsewhere classified: Secondary | ICD-10-CM | POA: Diagnosis not present

## 2024-02-03 DIAGNOSIS — I10 Essential (primary) hypertension: Secondary | ICD-10-CM | POA: Diagnosis not present

## 2024-02-09 DIAGNOSIS — M25631 Stiffness of right wrist, not elsewhere classified: Secondary | ICD-10-CM | POA: Diagnosis not present

## 2024-02-12 ENCOUNTER — Encounter: Payer: Self-pay | Admitting: Cardiology

## 2024-02-12 ENCOUNTER — Ambulatory Visit: Attending: Cardiology | Admitting: Cardiology

## 2024-02-12 VITALS — BP 118/54 | HR 76 | Ht 71.0 in | Wt 218.0 lb

## 2024-02-12 DIAGNOSIS — R600 Localized edema: Secondary | ICD-10-CM | POA: Insufficient documentation

## 2024-02-12 DIAGNOSIS — I10 Essential (primary) hypertension: Secondary | ICD-10-CM | POA: Insufficient documentation

## 2024-02-12 NOTE — Patient Instructions (Signed)
 Medication Instructions:  STOP Aspirin    *If you need a refill on your cardiac medications before your next appointment, please call your pharmacy*  Lab Work: PROBNP  If you have labs (blood work) drawn today and your tests are completely normal, you will receive your results only by: MyChart Message (if you have MyChart) OR A paper copy in the mail If you have any lab test that is abnormal or we need to change your treatment, we will call you to review the results.  Testing/Procedures: ECHOCARDIOGRAM  Your physician has requested that you have an echocardiogram. Echocardiography is a painless test that uses sound waves to create images of your heart. It provides your doctor with information about the size and shape of your heart and how well your heart's chambers and valves are working. This procedure takes approximately one hour. There are no restrictions for this procedure. Please do NOT wear cologne, perfume, aftershave, or lotions (deodorant is allowed). Please arrive 15 minutes prior to your appointment time.  Please note: We ask at that you not bring children with you during ultrasound (echo/ vascular) testing. Due to room size and safety concerns, children are not allowed in the ultrasound rooms during exams. Our front office staff cannot provide observation of children in our lobby area while testing is being conducted. An adult accompanying a patient to their appointment will only be allowed in the ultrasound room at the discretion of the ultrasound technician under special circumstances. We apologize for any inconvenience.   Follow-Up: At [facility], you and your health needs are our priority.  As part of our continuing mission to provide you with exceptional heart care, our providers are all part of one team.  This team includes your primary Cardiologist (physician) and Advanced Practice Providers or APPs (Physician Assistants and Nurse Practitioners) who all work  together to provide you with the care you need, when you need it.  Your next appointment:   3 month(s)  Provider:   One of our Advanced Practice Providers (APPs): Morse Clause, PA-C  Lamarr Satterfield, NP Miriam Shams, NP  Olivia Pavy, PA-C Josefa Beauvais, NP  Leontine Salen, PA-C Orren Fabry, PA-C  Hao Meng, PA-C Ernest Dick, NP  Damien Braver, NP Jon Hails, PA-C  Waddell Donath, PA-C    Dayna Dunn, PA-C  Scott Weaver, PA-C Lum Louis, NP Katlyn West, NP Callie Goodrich, PA-C  Xika Zhao, NP Sheng Haley, PA-C    Kathleen Johnson, PA-C   We recommend signing up for the patient portal called MyChart.  Sign up information is provided on this After Visit Summary.  MyChart is used to connect with patients for Virtual Visits (Telemedicine).  Patients are able to view lab/test results, encounter notes, upcoming appointments, etc.  Non-urgent messages can be sent to your provider as well.   To learn more about what you can do with MyChart, go to [email or web address].

## 2024-02-12 NOTE — Progress Notes (Signed)
 Cardiology Office Note:  .   Date:  02/12/2024  ID:  Jose Schmitt, DOB 03-25-1941, MRN [id number] PCP: Delayne Artist PARAS, MD  [facility] Providers Cardiologist:  Newman Lawrence, MD PCP: Delayne Artist PARAS, MD  Chief Complaint  Patient presents with   Hypertension     EGAN Schmitt is a 83 y.o. male with hypertension, leg edema  Discussed the use of AI scribe software for clinical note transcription with the patient, who gave verbal consent to proceed.  History of Present Illness Jose Schmitt is an 83 year old male with hypertension who presents with a one-time high blood pressure reading and leg swelling. He was referred by Dr. Delayne for evaluation of his blood pressure and leg swelling.  Approximately three months ago, he experienced a one-time high blood pressure reading of 200/100. Since then, his blood pressure is well-controlled with daily monitoring, averaging 139-142 mmHg systolic. He takes medication for hypertension regularly and participates in a monitoring program with monthly feedback.  He experiences intermittent leg and foot swelling, which he manages by reducing excessive hydration and using compression socks. The swelling sometimes coincides with dietary habits, particularly when eating out once a day. He limits salt intake when cooking at home. No chest pain, shortness of breath, or other cardiac symptoms.      Vitals:   02/12/24 1037  BP: (!) 118/54  Pulse: 76  SpO2: 94%      Review of Systems  Cardiovascular:  Positive for leg swelling. Negative for chest pain, dyspnea on exertion, palpitations and syncope.        Studies Reviewed: SABRA        EKG 02/12/2024: Sinus rhythm with marked sinus arrhythmia Left anterior fascicular block When compared with ECG of 26-Jul-2011 11:13, Left anterior fascicular block is now Present    Labs 09/2023: Chol 156, TG 189, HDL 54, LDL 71 Hb 15 Cr 0.89 TSH 1.6  2024: HbA1C 5.4%     Physical  Exam Vitals and nursing note reviewed.  Constitutional:      General: He is not in acute distress. Neck:     Vascular: No JVD.  Cardiovascular:     Rate and Rhythm: Normal rate and regular rhythm.     Heart sounds: Normal heart sounds. No murmur heard. Pulmonary:     Effort: Pulmonary effort is normal.     Breath sounds: Normal breath sounds. No wheezing or rales.  Musculoskeletal:     Right lower leg: Edema (1+) present.     Left lower leg: Edema (1+) present.      VISIT DIAGNOSES:   ICD-10-CM   1. Primary hypertension  I10 EKG 12-Lead    2. Lower leg edema  R60.0 Pro b natriuretic peptide (BNP)    ECHOCARDIOGRAM COMPLETE       Jose Schmitt is a 83 y.o. male with hypertension, leg edema  Assessment & Plan Lower extremity edema: Chronic edema with unclear etiology; differential includes cardiac causes or venous insufficiency. No MI or CVA history. Hypertension controlled. Increased bleeding risk due to Alka-Seltzer use. - Order echocardiogram to assess cardiac function. - Order ProBNP blood test for cardiac congestion. - Adjust medications or prescribe stronger diuretic if ProBNP elevated. - Consider leg vein ultrasound if echocardiogram and ProBNP normal. - Advise discontinuation of Alka-Seltzer due to bleeding risk. - Recommend dietary sodium restriction <2g/day. - Encourage compression stockings; discuss alternatives with PCP.  Essential hypertension: Hypertension well controlled on current medications. One-time elevated reading  considered an outlier. No related symptoms. - Continue current antihypertensive medications. - Continue home blood pressure monitoring. - Reassured that elevated reading is an outlier.         F/u in 3 months  Signed, Newman JINNY Lawrence, MD

## 2024-02-13 ENCOUNTER — Ambulatory Visit: Payer: Self-pay | Admitting: Cardiology

## 2024-02-13 DIAGNOSIS — E669 Obesity, unspecified: Secondary | ICD-10-CM | POA: Diagnosis not present

## 2024-02-13 DIAGNOSIS — E782 Mixed hyperlipidemia: Secondary | ICD-10-CM | POA: Diagnosis not present

## 2024-02-13 DIAGNOSIS — I10 Essential (primary) hypertension: Secondary | ICD-10-CM | POA: Diagnosis not present

## 2024-02-13 LAB — PRO B NATRIURETIC PEPTIDE: NT-Pro BNP: 151 pg/mL (ref 0–486)

## 2024-02-16 DIAGNOSIS — M25631 Stiffness of right wrist, not elsewhere classified: Secondary | ICD-10-CM | POA: Diagnosis not present

## 2024-02-19 DIAGNOSIS — M25641 Stiffness of right hand, not elsewhere classified: Secondary | ICD-10-CM | POA: Diagnosis not present

## 2024-02-23 DIAGNOSIS — M25631 Stiffness of right wrist, not elsewhere classified: Secondary | ICD-10-CM | POA: Diagnosis not present

## 2024-02-23 DIAGNOSIS — M25641 Stiffness of right hand, not elsewhere classified: Secondary | ICD-10-CM | POA: Diagnosis not present

## 2024-02-28 DIAGNOSIS — S52501D Unspecified fracture of the lower end of right radius, subsequent encounter for closed fracture with routine healing: Secondary | ICD-10-CM | POA: Diagnosis not present

## 2024-02-28 DIAGNOSIS — S52614A Nondisplaced fracture of right ulna styloid process, initial encounter for closed fracture: Secondary | ICD-10-CM | POA: Diagnosis not present

## 2024-03-04 DIAGNOSIS — I10 Essential (primary) hypertension: Secondary | ICD-10-CM | POA: Diagnosis not present

## 2024-03-11 ENCOUNTER — Ambulatory Visit (HOSPITAL_COMMUNITY)
Admission: RE | Admit: 2024-03-11 | Discharge: 2024-03-11 | Disposition: A | Discharge status: Home / Self Care | Source: Ambulatory Visit | Attending: Cardiology | Admitting: Cardiology

## 2024-03-11 DIAGNOSIS — R600 Localized edema: Secondary | ICD-10-CM | POA: Insufficient documentation

## 2024-03-11 LAB — ECHOCARDIOGRAM COMPLETE
AR max vel: 3.63 cm2
AV Area VTI: 3.45 cm2
AV Area mean vel: 3.62 cm2
AV Mean grad: 3 mmHg
AV Peak grad: 6.3 mmHg
Ao pk vel: 1.25 m/s
Area-P 1/2: 2.37 cm2
MV M vel: 4.41 m/s
MV Peak grad: 77.8 mmHg
S' Lateral: 3.08 cm

## 2024-03-15 DIAGNOSIS — E782 Mixed hyperlipidemia: Secondary | ICD-10-CM | POA: Diagnosis not present

## 2024-03-15 DIAGNOSIS — E669 Obesity, unspecified: Secondary | ICD-10-CM | POA: Diagnosis not present

## 2024-03-15 DIAGNOSIS — I10 Essential (primary) hypertension: Secondary | ICD-10-CM | POA: Diagnosis not present

## 2024-03-18 DIAGNOSIS — M4326 Fusion of spine, lumbar region: Secondary | ICD-10-CM | POA: Diagnosis not present

## 2024-03-18 DIAGNOSIS — R2681 Unsteadiness on feet: Secondary | ICD-10-CM | POA: Diagnosis not present

## 2024-03-26 DIAGNOSIS — R2681 Unsteadiness on feet: Secondary | ICD-10-CM | POA: Diagnosis not present

## 2024-03-26 DIAGNOSIS — M4326 Fusion of spine, lumbar region: Secondary | ICD-10-CM | POA: Diagnosis not present

## 2024-03-27 DIAGNOSIS — S52501D Unspecified fracture of the lower end of right radius, subsequent encounter for closed fracture with routine healing: Secondary | ICD-10-CM | POA: Diagnosis not present

## 2024-03-27 DIAGNOSIS — S52614A Nondisplaced fracture of right ulna styloid process, initial encounter for closed fracture: Secondary | ICD-10-CM | POA: Diagnosis not present

## 2024-03-28 DIAGNOSIS — R2681 Unsteadiness on feet: Secondary | ICD-10-CM | POA: Diagnosis not present

## 2024-03-28 DIAGNOSIS — M4326 Fusion of spine, lumbar region: Secondary | ICD-10-CM | POA: Diagnosis not present

## 2024-04-02 DIAGNOSIS — R2681 Unsteadiness on feet: Secondary | ICD-10-CM | POA: Diagnosis not present

## 2024-04-02 DIAGNOSIS — M4326 Fusion of spine, lumbar region: Secondary | ICD-10-CM | POA: Diagnosis not present

## 2024-04-03 DIAGNOSIS — I10 Essential (primary) hypertension: Secondary | ICD-10-CM | POA: Diagnosis not present

## 2024-04-14 DIAGNOSIS — E782 Mixed hyperlipidemia: Secondary | ICD-10-CM | POA: Diagnosis not present

## 2024-04-14 DIAGNOSIS — E669 Obesity, unspecified: Secondary | ICD-10-CM | POA: Diagnosis not present

## 2024-04-14 DIAGNOSIS — I10 Essential (primary) hypertension: Secondary | ICD-10-CM | POA: Diagnosis not present

## 2024-04-16 DIAGNOSIS — R2681 Unsteadiness on feet: Secondary | ICD-10-CM | POA: Diagnosis not present

## 2024-04-16 DIAGNOSIS — M4326 Fusion of spine, lumbar region: Secondary | ICD-10-CM | POA: Diagnosis not present

## 2024-04-18 DIAGNOSIS — M4326 Fusion of spine, lumbar region: Secondary | ICD-10-CM | POA: Diagnosis not present

## 2024-04-18 DIAGNOSIS — R2681 Unsteadiness on feet: Secondary | ICD-10-CM | POA: Diagnosis not present

## 2024-04-23 DIAGNOSIS — M4326 Fusion of spine, lumbar region: Secondary | ICD-10-CM | POA: Diagnosis not present

## 2024-04-23 DIAGNOSIS — R2681 Unsteadiness on feet: Secondary | ICD-10-CM | POA: Diagnosis not present

## 2024-04-25 DIAGNOSIS — M4326 Fusion of spine, lumbar region: Secondary | ICD-10-CM | POA: Diagnosis not present

## 2024-04-25 DIAGNOSIS — R2681 Unsteadiness on feet: Secondary | ICD-10-CM | POA: Diagnosis not present

## 2024-06-05 ENCOUNTER — Encounter: Payer: Self-pay | Admitting: Podiatry

## 2024-06-05 ENCOUNTER — Ambulatory Visit: Admitting: Podiatry

## 2024-06-05 DIAGNOSIS — R0989 Other specified symptoms and signs involving the circulatory and respiratory systems: Secondary | ICD-10-CM | POA: Diagnosis not present

## 2024-06-05 DIAGNOSIS — B351 Tinea unguium: Secondary | ICD-10-CM | POA: Diagnosis not present

## 2024-06-05 DIAGNOSIS — M79675 Pain in left toe(s): Secondary | ICD-10-CM

## 2024-06-05 DIAGNOSIS — M79674 Pain in right toe(s): Secondary | ICD-10-CM

## 2024-06-05 DIAGNOSIS — G629 Polyneuropathy, unspecified: Secondary | ICD-10-CM

## 2024-06-05 NOTE — Progress Notes (Signed)
"  °  Subjective:  Patient ID: Jose Schmitt, male    DOB: 06/15/1940,   MRN: [id number]  Chief Complaint  Patient presents with   Nail Problem    Check me, this is a follow-up for some problems I was having with my big right big toe.  It's doing fine.  The new nail is about 3/4 ways out.  I'd like her to clip my nails.    84 y.o. male presents for concern of thickened elongated and painful nails that are difficult to trim. Requesting to have them trimmed today. He has a history of back surgery and numbness in his feet. Unable to trim nails himself. HE relates some discoloration in his feet that has him concerned and changes from white to red. HE denies much pain.   PCP:  Delayne Artist PARAS, MD    . Denies any other pedal complaints. Denies n/v/f/c.   Past Medical History:  Diagnosis Date   Arthritis    Chronic back pain    DDD and stenosis   GERD (gastroesophageal reflux disease)    takes Zantac daily   History of IBS    Hx of seasonal allergies    Hyperlipidemia    takes Zocor nightly   Hypertension    takes Lisinopril ,Atenolol ,Hctz daily   Impaired hearing    right ear   Kidney stones    hx of    Prostate cancer (HCC)    Thyroid  nodule     Objective:  Physical Exam: Vascular: DP/PT pulses 1/4 bilateral. CFT <3 seconds. Absent hair growth on digits. Edema noted to bilateral lower extremities. Xerosis noted bilaterally.  Skin. No lacerations or abrasions bilateral feet. Nails 1-5 bilateral  are thickened discolored and elongated with subungual debris. Left hallux nail with eccymosis underlying. Right hallux nail growing well. Some erythema noted to dorsum of foot bilateral.  Musculoskeletal: MMT 5/5 bilateral lower extremities in DF, PF, Inversion and Eversion. Deceased ROM in DF of ankle joint. No pain to palpation about foot.  Neurological: Sensation intact to light touch. Protective sensation diminished bilateral.    Assessment:   1. Pain due to onychomycosis of toenails of  both feet   2. Peripheral polyneuropathy   3. Diminished pulses in lower extremity      Plan:  Patient was evaluated and treated and all questions answered. Discussed some concerned with discoloration of the left great toe and also some concern for a rash that has since resolved. Discussed possible etiologies and treatments.  -ABN signed previous visit  -Mechanically debrided all nails 1-5 bilateral using sterile nail nipper and filed with dremel without incident.  -Given skin changes and diminished pulses noted today ABIS ordered to evaluate for circulation.  -Answered all patient questions -Patient to return  in 6 months for at risk foot care -Patient advised to call the office if any problems or questions arise in the meantime.   [name], DPM    "

## 2024-06-25 ENCOUNTER — Ambulatory Visit (HOSPITAL_COMMUNITY)

## 2024-12-04 ENCOUNTER — Ambulatory Visit: Admitting: Podiatry
